# Patient Record
Sex: Female | Born: 1940 | Race: White | Hispanic: No | Smoking: Never smoker
Health system: Southern US, Community
[De-identification: ages and names within clinical notes are randomized; demographics above are authoritative.]

## PROBLEM LIST (undated history)

## (undated) DIAGNOSIS — E119 Type 2 diabetes mellitus without complications: Secondary | ICD-10-CM

## (undated) DIAGNOSIS — M1990 Unspecified osteoarthritis, unspecified site: Secondary | ICD-10-CM

## (undated) DIAGNOSIS — I10 Essential (primary) hypertension: Secondary | ICD-10-CM

## (undated) DIAGNOSIS — T4145XA Adverse effect of unspecified anesthetic, initial encounter: Secondary | ICD-10-CM

## (undated) HISTORY — PX: TONSILLECTOMY: SUR1361

## (undated) HISTORY — PX: EYE SURGERY: SHX253

## (undated) HISTORY — PX: CHOLECYSTECTOMY: SHX55

---

## 2015-05-30 DIAGNOSIS — T8859XA Other complications of anesthesia, initial encounter: Secondary | ICD-10-CM

## 2015-05-30 HISTORY — PX: BACK SURGERY: SHX140

## 2015-05-30 HISTORY — DX: Other complications of anesthesia, initial encounter: T88.59XA

## 2017-07-23 ENCOUNTER — Other Ambulatory Visit: Payer: Self-pay | Admitting: Orthopedic Surgery

## 2017-08-15 NOTE — Patient Instructions (Addendum)
Helen NationsJane Horton  08/15/2017   Your procedure is scheduled on: Friday 08/24/2017  Report to [facility] Main  Entrance              Report to admitting at  0705  AM    Call this number if you have problems the morning of surgery [phone]                Remember: Do not eat food or drink liquids :After Midnight.   How to Manage Your Diabetes Before and After Surgery  Why is it important to control my blood sugar before and after surgery? . Improving blood sugar levels before and after surgery helps healing and can limit problems. . A way of improving blood sugar control is eating a healthy diet by: o  Eating less sugar and carbohydrates o  Increasing activity/exercise o  Talking with your doctor about reaching your blood sugar goals . High blood sugars (greater than 180 mg/dL) can raise your risk of infections and slow your recovery, so you will need to focus on controlling your diabetes during the weeks before surgery. . Make sure that the doctor who takes care of your diabetes knows about your planned surgery including the date and location.  How do I manage my blood sugar before surgery? . Check your blood sugar at least 4 times a day, starting 2 days before surgery, to make sure that the level is not too high or low. o Check your blood sugar the morning of your surgery when you wake up and every 2 hours until you get to the Short Stay unit. . If your blood sugar is less than 70 mg/dL, you will need to treat for low blood sugar: o Do not take insulin. o Treat a low blood sugar (less than 70 mg/dL) with  cup of clear juice (cranberry or apple), 4 glucose tablets, OR glucose gel. o Recheck blood sugar in 15 minutes after treatment (to make sure it is greater than 70 mg/dL). If your blood sugar is not greater than 70 mg/dL on recheck, call [phone] for further instructions. . Report your blood sugar to the short stay nurse when you get to Short Stay.  . If  you are admitted to the hospital after surgery: o Your blood sugar will be checked by the staff and you will probably be given insulin after surgery (instead of oral diabetes medicines) to make sure you have good blood sugar levels. o The goal for blood sugar control after surgery is 80-180 mg/dL.   WHAT DO I DO ABOUT MY DIABETES MEDICATION?            Take the day before surgery on Thursday 08/23/2017 your Metformin as directed.   . Do not take oral diabetes medicines (pills) the morning of surgery.     Take these medicines the morning of surgery with A SIP OF WATER: Levothyroxine (Synthroid), Omeprazole (Prilosec), use eye drops if needed   DO NOT TAKE ANY DIABETIC MEDICATIONS DAY OF YOUR SURGERY                               You may not have any metal on your body including hair pins and              piercings  Do not wear jewelry, make-up, lotions, powders or  perfumes, deodorant             Do not wear nail polish.  Do not shave  48 hours prior to surgery.              Men may shave face and neck.   Do not bring valuables to the hospital. [facility] IS NOT             RESPONSIBLE   FOR VALUABLES.  Contacts, dentures or bridgework may not be worn into surgery.  Leave suitcase in the car. After surgery it may be brought to your room.                  Please read over the following fact sheets you were given: _____________________________________________________________________             [facility] - Preparing for Surgery Before surgery, you can play an important role.  Because skin is not sterile, your skin needs to be as free of germs as possible.  You can reduce the number of germs on your skin by washing with CHG (chlorahexidine gluconate) soap before surgery.  CHG is an antiseptic cleaner which kills germs and bonds with the skin to continue killing germs even after washing. Please DO NOT use if you have an allergy to CHG or antibacterial soaps.  If your skin becomes  reddened/irritated stop using the CHG and inform your nurse when you arrive at Short Stay. Do not shave (including legs and underarms) for at least 48 hours prior to the first CHG shower.  You may shave your face/neck. Please follow these instructions carefully:  1.  Shower with CHG Soap the night before surgery and the  morning of Surgery.  2.  If you choose to wash your hair, wash your hair first as usual with your  normal  shampoo.  3.  After you shampoo, rinse your hair and body thoroughly to remove the  shampoo.                           4.  Use CHG as you would any other liquid soap.  You can apply chg directly  to the skin and wash                       Gently with a scrungie or clean washcloth.  5.  Apply the CHG Soap to your body ONLY FROM THE NECK DOWN.   Do not use on face/ open                           Wound or open sores. Avoid contact with eyes, ears mouth and genitals (private parts).                       Wash face,  Genitals (private parts) with your normal soap.             6.  Wash thoroughly, paying special attention to the area where your surgery  will be performed.  7.  Thoroughly rinse your body with warm water from the neck down.  8.  DO NOT shower/wash with your normal soap after using and rinsing off  the CHG Soap.                9.  Pat yourself dry with a clean towel.  10.  Wear clean pajamas.            11.  Place clean sheets on your bed the night of your first shower and do not  sleep with pets. Day of Surgery : Do not apply any lotions/deodorants the morning of surgery.  Please wear clean clothes to the hospital/surgery center.  FAILURE TO FOLLOW THESE INSTRUCTIONS MAY RESULT IN THE CANCELLATION OF YOUR SURGERY PATIENT SIGNATURE_________________________________  NURSE SIGNATURE__________________________________  ________________________________________________________________________   Helen Horton  An incentive spirometer is a tool that  can help keep your lungs clear and active. This tool measures how well you are filling your lungs with each breath. Taking long deep breaths may help reverse or decrease the chance of developing breathing (pulmonary) problems (especially infection) following:  A long period of time when you are unable to move or be active. BEFORE THE PROCEDURE   If the spirometer includes an indicator to show your best effort, your nurse or respiratory therapist will set it to a desired goal.  If possible, sit up straight or lean slightly forward. Try not to slouch.  Hold the incentive spirometer in an upright position. INSTRUCTIONS FOR USE  1. Sit on the edge of your bed if possible, or sit up as far as you can in bed or on a chair. 2. Hold the incentive spirometer in an upright position. 3. Breathe out normally. 4. Place the mouthpiece in your mouth and seal your lips tightly around it. 5. Breathe in slowly and as deeply as possible, raising the piston or the ball toward the top of the column. 6. Hold your breath for 3-5 seconds or for as long as possible. Allow the piston or ball to fall to the bottom of the column. 7. Remove the mouthpiece from your mouth and breathe out normally. 8. Rest for a few seconds and repeat Steps 1 through 7 at least 10 times every 1-2 hours when you are awake. Take your time and take a few normal breaths between deep breaths. 9. The spirometer may include an indicator to show your best effort. Use the indicator as a goal to work toward during each repetition. 10. After each set of 10 deep breaths, practice coughing to be sure your lungs are clear. If you have an incision (the cut made at the time of surgery), support your incision when coughing by placing a pillow or rolled up towels firmly against it. Once you are able to get out of bed, walk around indoors and cough well. You may stop using the incentive spirometer when instructed by your caregiver.  RISKS AND  COMPLICATIONS  Take your time so you do not get dizzy or light-headed.  If you are in pain, you may need to take or ask for pain medication before doing incentive spirometry. It is harder to take a deep breath if you are having pain. AFTER USE  Rest and breathe slowly and easily.  It can be helpful to keep track of a log of your progress. Your caregiver can provide you with a simple table to help with this. If you are using the spirometer at home, follow these instructions: SEEK MEDICAL CARE IF:   You are having difficultly using the spirometer.  You have trouble using the spirometer as often as instructed.  Your pain medication is not giving enough relief while using the spirometer.  You develop fever of 100.5 F (38.1 C) or higher. SEEK IMMEDIATE MEDICAL CARE IF:   You cough up bloody  sputum that had not been present before.  You develop fever of 102 F (38.9 C) or greater.  You develop worsening pain at or near the incision site. MAKE SURE YOU:   Understand these instructions.  Will watch your condition.  Will get help right away if you are not doing well or get worse. Document Released: 09/25/2006 Document Revised: 08/07/2011 Document Reviewed: 11/26/2006 Exit[employer] Patient Information 2014 [employer], [place].   ________________________________________________________________________  WHAT IS A BLOOD TRANSFUSION? Blood Transfusion Information  A transfusion is the replacement of blood or some of its parts. Blood is made up of multiple cells which provide different functions.  Red blood cells carry oxygen and are used for blood loss replacement.  White blood cells fight against infection.  Platelets control bleeding.  Plasma helps clot blood.  Other blood products are available for specialized needs, such as hemophilia or other clotting disorders. BEFORE THE TRANSFUSION  Who gives blood for transfusions?   Healthy volunteers who are fully evaluated to make sure  their blood is safe. This is blood bank blood. Transfusion therapy is the safest it has ever been in the practice of medicine. Before blood is taken from a donor, a complete history is taken to make sure that person has no history of diseases nor engages in risky social behavior (examples are intravenous drug use or sexual activity with multiple partners). The donor's travel history is screened to minimize risk of transmitting infections, such as malaria. The donated blood is tested for signs of infectious diseases, such as HIV and hepatitis. The blood is then tested to be sure it is compatible with you in order to minimize the chance of a transfusion reaction. If you or a relative donates blood, this is often done in anticipation of surgery and is not appropriate for emergency situations. It takes many days to process the donated blood. RISKS AND COMPLICATIONS Although transfusion therapy is very safe and saves many lives, the main dangers of transfusion include:   Getting an infectious disease.  Developing a transfusion reaction. This is an allergic reaction to something in the blood you were given. Every precaution is taken to prevent this. The decision to have a blood transfusion has been considered carefully by your caregiver before blood is given. Blood is not given unless the benefits outweigh the risks. AFTER THE TRANSFUSION  Right after receiving a blood transfusion, you will usually feel much better and more energetic. This is especially true if your red blood cells have gotten low (anemic). The transfusion raises the level of the red blood cells which carry oxygen, and this usually causes an energy increase.  The nurse administering the transfusion will monitor you carefully for complications. HOME CARE INSTRUCTIONS  No special instructions are needed after a transfusion. You may find your energy is better. Speak with your caregiver about any limitations on activity for underlying diseases  you may have. SEEK MEDICAL CARE IF:   Your condition is not improving after your transfusion.  You develop redness or irritation at the intravenous (IV) site. SEEK IMMEDIATE MEDICAL CARE IF:  Any of the following symptoms occur over the next 12 hours:  Shaking chills.  You have a temperature by mouth above 102 F (38.9 C), not controlled by medicine.  Chest, back, or muscle pain.  People around you feel you are not acting correctly or are confused.  Shortness of breath or difficulty breathing.  Dizziness and fainting.  You get a rash or develop hives.  You have a  decrease in urine output.  Your urine turns a dark color or changes to pink, red, or brown. Any of the following symptoms occur over the next 10 days:  You have a temperature by mouth above 102 F (38.9 C), not controlled by medicine.  Shortness of breath.  Weakness after normal activity.  The white part of the eye turns yellow (jaundice).  You have a decrease in the amount of urine or are urinating less often.  Your urine turns a dark color or changes to pink, red, or brown. Document Released: 05/12/2000 Document Revised: 08/07/2011 Document Reviewed: 12/30/2007 [facility] Patient Information 2014 [place], [place].  _______________________________________________________________________

## 2017-08-17 ENCOUNTER — Encounter (HOSPITAL_COMMUNITY): Payer: Self-pay | Admitting: *Deleted

## 2017-08-17 ENCOUNTER — Ambulatory Visit (HOSPITAL_COMMUNITY)
Admission: RE | Admit: 2017-08-17 | Discharge: 2017-08-17 | Disposition: A | Discharge status: Home / Self Care | Payer: Medicare Other | Source: Ambulatory Visit | Attending: Orthopedic Surgery | Admitting: Orthopedic Surgery

## 2017-08-17 ENCOUNTER — Other Ambulatory Visit: Payer: Self-pay

## 2017-08-17 ENCOUNTER — Encounter (HOSPITAL_COMMUNITY)
Admission: RE | Admit: 2017-08-17 | Discharge: 2017-08-17 | Disposition: A | Discharge status: Home / Self Care | Payer: Medicare Other | Source: Ambulatory Visit | Attending: Orthopedic Surgery | Admitting: Orthopedic Surgery

## 2017-08-17 DIAGNOSIS — Z01818 Encounter for other preprocedural examination: Secondary | ICD-10-CM

## 2017-08-17 DIAGNOSIS — E119 Type 2 diabetes mellitus without complications: Secondary | ICD-10-CM | POA: Insufficient documentation

## 2017-08-17 DIAGNOSIS — J9811 Atelectasis: Secondary | ICD-10-CM | POA: Diagnosis not present

## 2017-08-17 DIAGNOSIS — I10 Essential (primary) hypertension: Secondary | ICD-10-CM | POA: Diagnosis not present

## 2017-08-17 HISTORY — DX: Unspecified osteoarthritis, unspecified site: M19.90

## 2017-08-17 HISTORY — DX: Essential (primary) hypertension: I10

## 2017-08-17 HISTORY — DX: Type 2 diabetes mellitus without complications: E11.9

## 2017-08-17 HISTORY — DX: Adverse effect of unspecified anesthetic, initial encounter: T41.45XA

## 2017-08-17 LAB — CBC WITH DIFFERENTIAL/PLATELET
BASOS PCT: 1 %
Basophils Absolute: 0.1 10*3/uL (ref 0.0–0.1)
Eosinophils Absolute: 0.2 10*3/uL (ref 0.0–0.7)
Eosinophils Relative: 2 %
HEMATOCRIT: 38.9 % (ref 36.0–46.0)
HEMOGLOBIN: 12.9 g/dL (ref 12.0–15.0)
LYMPHS ABS: 1.9 10*3/uL (ref 0.7–4.0)
LYMPHS PCT: 27 %
MCH: 28.5 pg (ref 26.0–34.0)
MCHC: 33.2 g/dL (ref 30.0–36.0)
MCV: 85.9 fL (ref 78.0–100.0)
MONO ABS: 0.8 10*3/uL (ref 0.1–1.0)
MONOS PCT: 11 %
NEUTROS PCT: 59 %
Neutro Abs: 4.1 10*3/uL (ref 1.7–7.7)
Platelets: 330 10*3/uL (ref 150–400)
RBC: 4.53 MIL/uL (ref 3.87–5.11)
RDW: 12.5 % (ref 11.5–15.5)
WBC: 7 10*3/uL (ref 4.0–10.5)

## 2017-08-17 LAB — URINALYSIS, ROUTINE W REFLEX MICROSCOPIC
BACTERIA UA: NONE SEEN
BILIRUBIN URINE: NEGATIVE
GLUCOSE, UA: NEGATIVE mg/dL
KETONES UR: NEGATIVE mg/dL
Leukocytes, UA: NEGATIVE
NITRITE: NEGATIVE
PROTEIN: NEGATIVE mg/dL
Specific Gravity, Urine: 1.012 (ref 1.005–1.030)
pH: 6 (ref 5.0–8.0)

## 2017-08-17 LAB — PROTIME-INR
INR: 1.06
PROTHROMBIN TIME: 13.8 s (ref 11.4–15.2)

## 2017-08-17 LAB — COMPREHENSIVE METABOLIC PANEL
ALK PHOS: 84 U/L (ref 38–126)
ALT: 15 U/L (ref 14–54)
ANION GAP: 13 (ref 5–15)
AST: 27 U/L (ref 15–41)
Albumin: 4 g/dL (ref 3.5–5.0)
BILIRUBIN TOTAL: 0.7 mg/dL (ref 0.3–1.2)
BUN: 13 mg/dL (ref 6–20)
CALCIUM: 9.2 mg/dL (ref 8.9–10.3)
CO2: 28 mmol/L (ref 22–32)
Chloride: 96 mmol/L — ABNORMAL LOW (ref 101–111)
Creatinine, Ser: 0.74 mg/dL (ref 0.44–1.00)
GFR calc Af Amer: >60 mL/min (ref >60)
GLUCOSE: 84 mg/dL (ref 65–99)
Potassium: 3.7 mmol/L (ref 3.5–5.1)
Sodium: 137 mmol/L (ref 135–145)
TOTAL PROTEIN: 7.4 g/dL (ref 6.5–8.1)

## 2017-08-17 LAB — GLUCOSE, CAPILLARY: Glucose-Capillary: 85 mg/dL (ref 65–99)

## 2017-08-17 LAB — ABO/RH: ABO/RH(D): O POS

## 2017-08-17 LAB — HEMOGLOBIN A1C
HEMOGLOBIN A1C: 5.8 % — AB (ref 4.8–5.6)
Mean Plasma Glucose: 119.76 mg/dL

## 2017-08-17 LAB — SURGICAL PCR SCREEN
MRSA, PCR: INVALID — AB
Staphylococcus aureus: INVALID — AB

## 2017-08-17 LAB — APTT: aPTT: 30 seconds (ref 24–36)

## 2017-08-20 LAB — MRSA CULTURE: CULTURE: NOT DETECTED

## 2017-08-21 NOTE — Progress Notes (Signed)
Called and spoke to patient and instructed her to arrive at 0530 and call [phone] on the phone at the volunteer desk and have a seat until to the nurse comes to get her to take her to Short Stay. Patient verbalized understanding.

## 2017-08-23 NOTE — Anesthesia Preprocedure Evaluation (Addendum)
Anesthesia Evaluation  Patient identified by MRN, date of birth, ID band Patient awake    Reviewed: Allergy & Precautions, NPO status , Patient's Chart, lab work & pertinent test results  Airway Mallampati: II  TM Distance: >3 FB Neck ROM: Full    Dental no notable dental hx.    Pulmonary neg pulmonary ROS,    Pulmonary exam normal breath sounds clear to auscultation       Cardiovascular Exercise Tolerance: Good hypertension, Normal cardiovascular exam Rhythm:Regular Rate:Normal     Neuro/Psych negative neurological ROS     GI/Hepatic negative GI ROS,   Endo/Other  diabetes, Type 2  Renal/GU      Musculoskeletal  (+) Arthritis ,   Abdominal   Peds  Hematology   Anesthesia Other Findings   Reproductive/Obstetrics                            Lab Results  Component Value Date   WBC 7.0 08/17/2017   HGB 12.9 08/17/2017   HCT 38.9 08/17/2017   MCV 85.9 08/17/2017   PLT 330 08/17/2017    Anesthesia Physical Anesthesia Plan  ASA: II  Anesthesia Plan: Spinal   Post-op Pain Management:    Induction:   PONV Risk Score and Plan:   Airway Management Planned: Mask, Natural Airway and Nasal Cannula  Additional Equipment:   Intra-op Plan:   Post-operative Plan:   Informed Consent: I have reviewed the patients History and Physical, chart, labs and discussed the procedure including the risks, benefits and alternatives for the proposed anesthesia with the patient or authorized representative who has indicated his/her understanding and acceptance.     Plan Discussed with: CRNA  Anesthesia Plan Comments:         Anesthesia Quick Evaluation

## 2017-08-24 ENCOUNTER — Inpatient Hospital Stay (HOSPITAL_COMMUNITY): Payer: Medicare Other | Admitting: Certified Registered Nurse Anesthetist

## 2017-08-24 ENCOUNTER — Inpatient Hospital Stay (HOSPITAL_COMMUNITY)
Admission: RE | Admit: 2017-08-24 | Discharge: 2017-08-25 | DRG: 470 | Disposition: A | Discharge status: Home Health | Payer: Medicare Other | Source: Ambulatory Visit | Attending: Orthopedic Surgery | Admitting: Orthopedic Surgery

## 2017-08-24 ENCOUNTER — Encounter (HOSPITAL_COMMUNITY): Payer: Self-pay | Admitting: *Deleted

## 2017-08-24 ENCOUNTER — Inpatient Hospital Stay (HOSPITAL_COMMUNITY): Payer: Medicare Other

## 2017-08-24 ENCOUNTER — Encounter (HOSPITAL_COMMUNITY)
Admission: RE | Disposition: A | Discharge status: Home Health | Payer: Self-pay | Source: Ambulatory Visit | Attending: Orthopedic Surgery

## 2017-08-24 ENCOUNTER — Other Ambulatory Visit: Payer: Self-pay

## 2017-08-24 DIAGNOSIS — E119 Type 2 diabetes mellitus without complications: Secondary | ICD-10-CM | POA: Diagnosis present

## 2017-08-24 DIAGNOSIS — M160 Bilateral primary osteoarthritis of hip: Secondary | ICD-10-CM | POA: Diagnosis present

## 2017-08-24 DIAGNOSIS — I10 Essential (primary) hypertension: Secondary | ICD-10-CM | POA: Diagnosis present

## 2017-08-24 DIAGNOSIS — M1611 Unilateral primary osteoarthritis, right hip: Secondary | ICD-10-CM | POA: Diagnosis present

## 2017-08-24 DIAGNOSIS — Z419 Encounter for procedure for purposes other than remedying health state, unspecified: Secondary | ICD-10-CM

## 2017-08-24 HISTORY — PX: TOTAL HIP ARTHROPLASTY: SHX124

## 2017-08-24 LAB — GLUCOSE, CAPILLARY
GLUCOSE-CAPILLARY: 125 mg/dL — AB (ref 65–99)
GLUCOSE-CAPILLARY: 183 mg/dL — AB (ref 65–99)
Glucose-Capillary: 123 mg/dL — ABNORMAL HIGH (ref 65–99)

## 2017-08-24 LAB — TYPE AND SCREEN
ABO/RH(D): O POS
Antibody Screen: NEGATIVE

## 2017-08-24 SURGERY — ARTHROPLASTY, HIP, TOTAL, ANTERIOR APPROACH
Anesthesia: Spinal | Site: Hip | Laterality: Right

## 2017-08-24 MED ORDER — FENTANYL CITRATE (PF) 100 MCG/2ML IJ SOLN
INTRAMUSCULAR | Status: AC
  Filled 2017-08-24: qty 2

## 2017-08-24 MED ORDER — PROPOFOL 500 MG/50ML IV EMUL
INTRAVENOUS | Status: DC | PRN
  Administered 2017-08-24: 50 ug/kg/min via INTRAVENOUS

## 2017-08-24 MED ORDER — DEXAMETHASONE SODIUM PHOSPHATE 10 MG/ML IJ SOLN
INTRAMUSCULAR | Status: AC
  Filled 2017-08-24: qty 1

## 2017-08-24 MED ORDER — HYDROMORPHONE HCL 1 MG/ML IJ SOLN
INTRAMUSCULAR | Status: AC
  Filled 2017-08-24: qty 1

## 2017-08-24 MED ORDER — BISACODYL 5 MG PO TBEC: 5.0000 mg | DELAYED_RELEASE_TABLET | Freq: Every day | ORAL | Status: DC | PRN

## 2017-08-24 MED ORDER — EPHEDRINE SULFATE-NACL 50-0.9 MG/10ML-% IV SOSY
PREFILLED_SYRINGE | INTRAVENOUS | Status: DC | PRN
  Administered 2017-08-24: 10 mg via INTRAVENOUS

## 2017-08-24 MED ORDER — MIDAZOLAM HCL 2 MG/2ML IJ SOLN
INTRAMUSCULAR | Status: AC
  Filled 2017-08-24: qty 2

## 2017-08-24 MED ORDER — ONDANSETRON HCL 4 MG/2ML IJ SOLN
INTRAMUSCULAR | Status: AC
  Filled 2017-08-24: qty 2

## 2017-08-24 MED ORDER — ASPIRIN EC 325 MG PO TBEC: 325.0000 mg | DELAYED_RELEASE_TABLET | Freq: Two times a day (BID) | ORAL | 0 refills | Status: DC

## 2017-08-24 MED ORDER — LISINOPRIL 10 MG PO TABS: 10.0000 mg | ORAL_TABLET | Freq: Every day | ORAL | Status: DC

## 2017-08-24 MED ORDER — SODIUM CHLORIDE 0.9 % IV SOLN
1000.0000 mg | INTRAVENOUS | Status: AC
Start: 2017-08-24 — End: 2017-08-24
  Administered 2017-08-24: 1000 mg via INTRAVENOUS
  Filled 2017-08-24: qty 1100

## 2017-08-24 MED ORDER — BUPIVACAINE HCL (PF) 0.25 % IJ SOLN
INTRAMUSCULAR | Status: DC | PRN
  Administered 2017-08-24: 30 mL

## 2017-08-24 MED ORDER — LACTATED RINGERS IV SOLN
INTRAVENOUS | Status: DC | PRN
  Administered 2017-08-24 (×3): via INTRAVENOUS

## 2017-08-24 MED ORDER — METHOCARBAMOL 1000 MG/10ML IJ SOLN
500.0000 mg | Freq: Four times a day (QID) | INTRAMUSCULAR | Status: DC | PRN
  Administered 2017-08-24: 500 mg via INTRAVENOUS
  Filled 2017-08-24: qty 550

## 2017-08-24 MED ORDER — HYDROCODONE-ACETAMINOPHEN 5-325 MG PO TABS
1.0000 | ORAL_TABLET | ORAL | Status: DC | PRN
  Administered 2017-08-24: 1 via ORAL
  Administered 2017-08-24: 22:00:00 2 via ORAL
  Administered 2017-08-24: 1 via ORAL
  Administered 2017-08-25 (×2): 2 via ORAL
  Filled 2017-08-24: qty 1
  Filled 2017-08-24 (×3): qty 2
  Filled 2017-08-24: qty 1

## 2017-08-24 MED ORDER — HYDROCODONE-ACETAMINOPHEN 5-325 MG PO TABS: 1.0000 | ORAL_TABLET | Freq: Four times a day (QID) | ORAL | 0 refills | Status: DC | PRN

## 2017-08-24 MED ORDER — FENTANYL CITRATE (PF) 100 MCG/2ML IJ SOLN
INTRAMUSCULAR | Status: DC | PRN
  Administered 2017-08-24 (×2): 50 ug via INTRAVENOUS

## 2017-08-24 MED ORDER — MIDAZOLAM HCL 5 MG/5ML IJ SOLN
INTRAMUSCULAR | Status: DC | PRN
  Administered 2017-08-24: 1 mg via INTRAVENOUS

## 2017-08-24 MED ORDER — ONDANSETRON HCL 4 MG PO TABS: 4.0000 mg | ORAL_TABLET | Freq: Four times a day (QID) | ORAL | Status: DC | PRN

## 2017-08-24 MED ORDER — BUPIVACAINE LIPOSOME 1.3 % IJ SUSP
20.0000 mL | Freq: Once | INTRAMUSCULAR | Status: DC
  Filled 2017-08-24: qty 20

## 2017-08-24 MED ORDER — LISINOPRIL-HYDROCHLOROTHIAZIDE 10-12.5 MG PO TABS: 1.0000 | ORAL_TABLET | Freq: Every day | ORAL | Status: DC

## 2017-08-24 MED ORDER — INSULIN ASPART 100 UNIT/ML ~~LOC~~ SOLN
0.0000 [IU] | Freq: Three times a day (TID) | SUBCUTANEOUS | Status: DC
  Administered 2017-08-24: 3 [IU] via SUBCUTANEOUS

## 2017-08-24 MED ORDER — METHOCARBAMOL 500 MG PO TABS
500.0000 mg | ORAL_TABLET | Freq: Four times a day (QID) | ORAL | Status: DC | PRN
  Administered 2017-08-24 – 2017-08-25 (×2): 500 mg via ORAL
  Filled 2017-08-24 (×2): qty 1

## 2017-08-24 MED ORDER — LIDOCAINE 2% (20 MG/ML) 5 ML SYRINGE
INTRAMUSCULAR | Status: DC | PRN
  Administered 2017-08-24: 20 mg via INTRAVENOUS

## 2017-08-24 MED ORDER — CHLORHEXIDINE GLUCONATE 4 % EX LIQD: 60.0000 mL | Freq: Once | CUTANEOUS | Status: DC

## 2017-08-24 MED ORDER — BUPIVACAINE HCL (PF) 0.25 % IJ SOLN
INTRAMUSCULAR | Status: AC
  Filled 2017-08-24: qty 30

## 2017-08-24 MED ORDER — BUPIVACAINE LIPOSOME 1.3 % IJ SUSP
INTRAMUSCULAR | Status: DC | PRN
  Administered 2017-08-24: 20 mL

## 2017-08-24 MED ORDER — DIPHENHYDRAMINE HCL 12.5 MG/5ML PO ELIX: 12.5000 mg | ORAL_SOLUTION | ORAL | Status: DC | PRN

## 2017-08-24 MED ORDER — HYDROCHLOROTHIAZIDE 12.5 MG PO CAPS: 12.5000 mg | ORAL_CAPSULE | Freq: Every day | ORAL | Status: DC

## 2017-08-24 MED ORDER — ACETAMINOPHEN 325 MG PO TABS: 325.0000 mg | ORAL_TABLET | Freq: Four times a day (QID) | ORAL | Status: DC | PRN

## 2017-08-24 MED ORDER — TRANEXAMIC ACID 1000 MG/10ML IV SOLN
1000.0000 mg | Freq: Once | INTRAVENOUS | Status: AC
  Administered 2017-08-24: 1000 mg via INTRAVENOUS
  Filled 2017-08-24: qty 1100

## 2017-08-24 MED ORDER — HYDROMORPHONE HCL 1 MG/ML IJ SOLN
0.2500 mg | INTRAMUSCULAR | Status: DC | PRN
  Administered 2017-08-24 (×4): 0.5 mg via INTRAVENOUS

## 2017-08-24 MED ORDER — ONDANSETRON HCL 4 MG/2ML IJ SOLN
4.0000 mg | Freq: Four times a day (QID) | INTRAMUSCULAR | Status: DC | PRN
  Administered 2017-08-24: 13:00:00 4 mg via INTRAVENOUS
  Filled 2017-08-24: qty 2

## 2017-08-24 MED ORDER — ACETAMINOPHEN 10 MG/ML IV SOLN: 1000.0000 mg | Freq: Once | INTRAVENOUS | Status: DC | PRN

## 2017-08-24 MED ORDER — TIZANIDINE HCL 2 MG PO TABS: 2.0000 mg | ORAL_TABLET | Freq: Three times a day (TID) | ORAL | 0 refills | Status: DC | PRN

## 2017-08-24 MED ORDER — CEFAZOLIN SODIUM-DEXTROSE 2-4 GM/100ML-% IV SOLN
2.0000 g | INTRAVENOUS | Status: AC
  Administered 2017-08-24: 2 g via INTRAVENOUS
  Filled 2017-08-24: qty 100

## 2017-08-24 MED ORDER — PROPOFOL 10 MG/ML IV BOLUS
INTRAVENOUS | Status: AC
  Filled 2017-08-24: qty 60

## 2017-08-24 MED ORDER — SODIUM CHLORIDE 0.9 % IV SOLN
INTRAVENOUS | Status: DC
  Administered 2017-08-24: 12:00:00 via INTRAVENOUS

## 2017-08-24 MED ORDER — POLYETHYLENE GLYCOL 3350 17 G PO PACK: 17.0000 g | PACK | Freq: Every day | ORAL | Status: DC | PRN

## 2017-08-24 MED ORDER — MORPHINE SULFATE (PF) 2 MG/ML IV SOLN: 0.5000 mg | INTRAVENOUS | Status: DC | PRN

## 2017-08-24 MED ORDER — DOCUSATE SODIUM 100 MG PO CAPS
100.0000 mg | ORAL_CAPSULE | Freq: Two times a day (BID) | ORAL | Status: DC
  Administered 2017-08-24 – 2017-08-25 (×2): 100 mg via ORAL
  Filled 2017-08-24 (×2): qty 1

## 2017-08-24 MED ORDER — METFORMIN HCL 500 MG PO TABS
1000.0000 mg | ORAL_TABLET | Freq: Every day | ORAL | Status: DC
  Administered 2017-08-25: 09:00:00 1000 mg via ORAL
  Filled 2017-08-24: qty 2

## 2017-08-24 MED ORDER — BUPIVACAINE IN DEXTROSE 0.75-8.25 % IT SOLN
INTRATHECAL | Status: DC | PRN
  Administered 2017-08-24: 1.8 mL via INTRATHECAL

## 2017-08-24 MED ORDER — SODIUM CHLORIDE 0.9 % IR SOLN
Status: DC | PRN
  Administered 2017-08-24: 1000 mL

## 2017-08-24 MED ORDER — HYDROCODONE-ACETAMINOPHEN 7.5-325 MG PO TABS: 1.0000 | ORAL_TABLET | Freq: Once | ORAL | Status: DC | PRN

## 2017-08-24 MED ORDER — PROPOFOL 10 MG/ML IV BOLUS
INTRAVENOUS | Status: DC | PRN
  Administered 2017-08-24 (×5): 10 mg via INTRAVENOUS

## 2017-08-24 MED ORDER — ALUM & MAG HYDROXIDE-SIMETH 200-200-20 MG/5ML PO SUSP: 30.0000 mL | ORAL | Status: DC | PRN

## 2017-08-24 MED ORDER — TEMAZEPAM 15 MG PO CAPS
15.0000 mg | ORAL_CAPSULE | Freq: Every day | ORAL | Status: DC
  Administered 2017-08-24: 22:00:00 15 mg via ORAL
  Filled 2017-08-24: qty 1

## 2017-08-24 MED ORDER — CEFAZOLIN SODIUM-DEXTROSE 2-4 GM/100ML-% IV SOLN
2.0000 g | Freq: Four times a day (QID) | INTRAVENOUS | Status: AC
  Administered 2017-08-24 (×2): 2 g via INTRAVENOUS
  Filled 2017-08-24 (×2): qty 100

## 2017-08-24 MED ORDER — DOCUSATE SODIUM 100 MG PO CAPS: 100.0000 mg | ORAL_CAPSULE | Freq: Two times a day (BID) | ORAL | 0 refills | Status: AC

## 2017-08-24 MED ORDER — PANTOPRAZOLE SODIUM 40 MG PO TBEC
80.0000 mg | DELAYED_RELEASE_TABLET | Freq: Every day | ORAL | Status: DC
  Administered 2017-08-25: 80 mg via ORAL
  Filled 2017-08-24: qty 2

## 2017-08-24 MED ORDER — LACTATED RINGERS IV SOLN: INTRAVENOUS | Status: DC

## 2017-08-24 MED ORDER — PROMETHAZINE HCL 25 MG/ML IJ SOLN: 6.2500 mg | INTRAMUSCULAR | Status: DC | PRN

## 2017-08-24 MED ORDER — ASPIRIN EC 325 MG PO TBEC
325.0000 mg | DELAYED_RELEASE_TABLET | Freq: Two times a day (BID) | ORAL | Status: DC
  Administered 2017-08-24 – 2017-08-25 (×2): 325 mg via ORAL
  Filled 2017-08-24 (×2): qty 1

## 2017-08-24 MED ORDER — LEVOTHYROXINE SODIUM 88 MCG PO TABS
88.0000 ug | ORAL_TABLET | Freq: Every day | ORAL | Status: DC
  Administered 2017-08-25: 09:00:00 88 ug via ORAL
  Filled 2017-08-24: qty 1

## 2017-08-24 MED ORDER — DEXAMETHASONE SODIUM PHOSPHATE 10 MG/ML IJ SOLN
INTRAMUSCULAR | Status: DC | PRN
  Administered 2017-08-24: 10 mg via INTRAVENOUS

## 2017-08-24 MED ORDER — MEPERIDINE HCL 50 MG/ML IJ SOLN: 6.2500 mg | INTRAMUSCULAR | Status: DC | PRN

## 2017-08-24 MED ORDER — ONDANSETRON HCL 4 MG/2ML IJ SOLN
INTRAMUSCULAR | Status: DC | PRN
  Administered 2017-08-24: 4 mg via INTRAVENOUS

## 2017-08-24 MED ORDER — MAGNESIUM CITRATE PO SOLN: 1.0000 | Freq: Once | ORAL | Status: DC | PRN

## 2017-08-24 SURGICAL SUPPLY — 39 items
BAG ZIPLOCK 12X15 (MISCELLANEOUS) ×3 IMPLANT
BENZOIN TINCTURE PRP APPL 2/3 (GAUZE/BANDAGES/DRESSINGS) ×3 IMPLANT
BLADE SAW SGTL 18X1.27X75 (BLADE) ×2 IMPLANT
BLADE SAW SGTL 18X1.27X75MM (BLADE) ×1
BNDG COHESIVE 6X5 TAN STRL LF (GAUZE/BANDAGES/DRESSINGS) IMPLANT
CAPT HIP TOTAL 2 ×3 IMPLANT
CELLS DAT CNTRL 66122 CELL SVR (MISCELLANEOUS) ×1 IMPLANT
CLOSURE WOUND 1/2 X4 (GAUZE/BANDAGES/DRESSINGS) ×1
COVER PERINEAL POST (MISCELLANEOUS) ×3 IMPLANT
COVER SURGICAL LIGHT HANDLE (MISCELLANEOUS) ×3 IMPLANT
DRAPE STERI IOBAN 125X83 (DRAPES) ×3 IMPLANT
DRAPE U-SHAPE 47X51 STRL (DRAPES) ×6 IMPLANT
DRSG AQUACEL AG ADV 3.5X 6 (GAUZE/BANDAGES/DRESSINGS) ×3 IMPLANT
DRSG AQUACEL AG ADV 3.5X10 (GAUZE/BANDAGES/DRESSINGS) IMPLANT
DURAPREP 26ML APPLICATOR (WOUND CARE) ×3 IMPLANT
ELECT REM PT RETURN 15FT ADLT (MISCELLANEOUS) ×3 IMPLANT
GAUZE XEROFORM 1X8 LF (GAUZE/BANDAGES/DRESSINGS) IMPLANT
GLOVE BIOGEL PI IND STRL 8 (GLOVE) ×2 IMPLANT
GLOVE BIOGEL PI INDICATOR 8 (GLOVE) ×4
GLOVE ECLIPSE 7.5 STRL STRAW (GLOVE) ×6 IMPLANT
GOWN STRL REUS W/TWL XL LVL3 (GOWN DISPOSABLE) ×6 IMPLANT
HOLDER FOLEY CATH W/STRAP (MISCELLANEOUS) ×3 IMPLANT
HOOD PEEL AWAY FLYTE STAYCOOL (MISCELLANEOUS) ×6 IMPLANT
NEEDLE HYPO 22GX1.5 SAFETY (NEEDLE) ×3 IMPLANT
PACK ANTERIOR HIP CUSTOM (KITS) ×3 IMPLANT
RTRCTR WOUND ALEXIS 18CM MED (MISCELLANEOUS) ×3
STAPLER VISISTAT 35W (STAPLE) IMPLANT
STRIP CLOSURE SKIN 1/2X4 (GAUZE/BANDAGES/DRESSINGS) ×2 IMPLANT
SUT ETHIBOND NAB CT1 #1 30IN (SUTURE) ×6 IMPLANT
SUT MNCRL AB 3-0 PS2 18 (SUTURE) IMPLANT
SUT MON AB 3-0 SH 27 (SUTURE) ×2
SUT MON AB 3-0 SH27 (SUTURE) ×1 IMPLANT
SUT VIC AB 0 CT1 36 (SUTURE) ×3 IMPLANT
SUT VIC AB 1 CT1 36 (SUTURE) ×6 IMPLANT
SUT VIC AB 2-0 CT1 27 (SUTURE) ×4
SUT VIC AB 2-0 CT1 TAPERPNT 27 (SUTURE) ×2 IMPLANT
TRAY FOLEY CATH SILVER 14FR (SET/KITS/TRAYS/PACK) ×3 IMPLANT
TRAY FOLEY W/METER SILVER 16FR (SET/KITS/TRAYS/PACK) IMPLANT
YANKAUER SUCT BULB TIP NO VENT (SUCTIONS) ×3 IMPLANT

## 2017-08-24 NOTE — Anesthesia Postprocedure Evaluation (Signed)
Anesthesia Post Note  Patient: Helen NationsJane Horton  Procedure(s) Performed: RIGHT TOTAL HIP ARTHROPLASTY ANTERIOR APPROACH (Right Hip)     Patient location during evaluation: PACU Anesthesia Type: Spinal Level of consciousness: oriented and awake and alert Pain management: pain level controlled Vital Signs Assessment: post-procedure vital signs reviewed and stable Respiratory status: spontaneous breathing, respiratory function stable and patient connected to nasal cannula oxygen Cardiovascular status: blood pressure returned to baseline and stable Postop Assessment: no headache, no backache and no apparent nausea or vomiting Anesthetic complications: no    Last Vitals:  Vitals:   08/24/17 1030 08/24/17 1045  BP: 138/61 138/60  Pulse: 84 91  Resp: 15 14  Temp: 36.6 C 36.6 C  SpO2: 100% 100%    Last Pain:  Vitals:   08/24/17 1030  TempSrc:   PainSc: 3                  Trevor IhaStephen A Marc Sivertsen

## 2017-08-24 NOTE — Brief Op Note (Signed)
08/24/2017  11:25 AM  PATIENT:  Helen Horton  77 y.o. female  PRE-OPERATIVE DIAGNOSIS:  OSTEOARTHRITIS RIGHT HIP  POST-OPERATIVE DIAGNOSIS:  OSTEOARTHRITIS RIGHT HIP  PROCEDURE:  Procedure(s): RIGHT TOTAL HIP ARTHROPLASTY ANTERIOR APPROACH (Right)  SURGEON:  Surgeon(s) and Role:    Jodi Geralds* Theia Dezeeuw, MD - Primary  PHYSICIAN ASSISTANT:   ASSISTANTS: bethune   ANESTHESIA:   spinal  EBL:  200 mL   BLOOD ADMINISTERED:none  DRAINS: none   LOCAL MEDICATIONS USED:  MARCAINE    and OTHER experel  SPECIMEN:  No Specimen  DISPOSITION OF SPECIMEN:  N/A  COUNTS:  YES  TOURNIQUET:  * No tourniquets in log *  DICTATION: .Other Dictation: Dictation Number [id number]  PLAN OF CARE: Admit to inpatient   PATIENT DISPOSITION:  PACU - hemodynamically stable.   Delay start of Pharmacological VTE agent (>24hrs) due to surgical blood loss or risk of bleeding: no

## 2017-08-24 NOTE — Transfer of Care (Signed)
Immediate Anesthesia Transfer of Care Note  Patient: Helen NationsJane Whetsel  Procedure(s) Performed: RIGHT TOTAL HIP ARTHROPLASTY ANTERIOR APPROACH (Right Hip)  Patient Location: PACU  Anesthesia Type:Spinal  Level of Consciousness: drowsy and patient cooperative  Airway & Oxygen Therapy: Patient Spontanous Breathing and Patient connected to face mask oxygen  Post-op Assessment: Report given to RN and Post -op Vital signs reviewed and stable  Post vital signs: Reviewed and stable  Last Vitals:  Vitals Value Taken Time  BP    Temp    Pulse 84 08/24/2017  9:26 AM  Resp 13 08/24/2017  9:26 AM  SpO2 100 % 08/24/2017  9:26 AM  Vitals shown include unvalidated device data.  Last Pain:  Vitals:   08/24/17 0628  TempSrc:   PainSc: 3       Patients Stated Pain Goal: 4 (08/24/17 [id number])  Complications: No apparent anesthesia complications

## 2017-08-24 NOTE — Anesthesia Procedure Notes (Signed)
Spinal  Patient location during procedure: OR Start time: 08/24/2017 7:23 AM End time: 08/24/2017 7:30 AM Staffing Anesthesiologist: Trevor IhaHouser, Tristen Pennino A, MD Performed: anesthesiologist  Preanesthetic Checklist Completed: patient identified, surgical consent, pre-op evaluation, timeout performed, IV checked, risks and benefits discussed and monitors and equipment checked Spinal Block Patient position: sitting Prep: site prepped and draped and DuraPrep Patient monitoring: heart rate, cardiac monitor, continuous pulse ox and blood pressure Approach: midline Location: L2-3 Injection technique: single-shot Needle Needle type: Pencan  Needle gauge: 24 G Needle length: 10 cm Assessment Sensory level: T4

## 2017-08-24 NOTE — Discharge Instructions (Signed)

## 2017-08-24 NOTE — Evaluation (Signed)
Physical Therapy Evaluation Patient Details Name: Helen Horton MRN: [id number] DOB: Oct 13, 1940 Today's Date: 08/24/2017   History of Present Illness  [age] y.o. female admitted for R DA-THA. PMH of L4-L5 laminectomy  Clinical Impression  Pt is s/p THA resulting in the deficits listed below (see PT Problem List). Pt ambulated 3455' with RW and performed THA exercises with min assist. Good progress expected.  Pt will benefit from skilled PT to increase their independence and safety with mobility to allow discharge to the venue listed below.      Follow Up Recommendations Follow surgeon's recommendation for DC plan and follow-up therapies    Equipment Recommendations  Rolling walker with 5" wheels    Recommendations for Other Services       Precautions / Restrictions Precautions Precautions: Fall Precaution Comments: denies h/o falls Restrictions Weight Bearing Restrictions: No Other Position/Activity Restrictions: wbat      Mobility  Bed Mobility Overal bed mobility: Modified Independent             General bed mobility comments: HOB up  Transfers Overall transfer level: Needs assistance Equipment used: Rolling walker (2 wheeled) Transfers: Sit to/from Stand Sit to Stand: Min guard         General transfer comment: VCs hand placement  Ambulation/Gait Ambulation/Gait assistance: Min guard Ambulation Distance (Feet): 55 Feet Assistive device: Rolling walker (2 wheeled) Gait Pattern/deviations: Step-to pattern     General Gait Details: steady with RW, no loss of balance, VCs sequencing  Stairs            Wheelchair Mobility    Modified Rankin (Stroke Patients Only)       Balance Overall balance assessment: Modified Independent                                           Pertinent Vitals/Pain Pain Assessment: 0-10 Pain Score: 4  Pain Location: R hip  Pain Descriptors / Indicators: Sore Pain Intervention(s): Limited activity within  patient's tolerance;Monitored during session;Premedicated before session;Ice applied    Home Living Family/patient expects to be discharged to:: Private residence Living Arrangements: Alone Available Help at Discharge: Family;Available 24 hours/day   Home Access: Stairs to enter   Entrance Stairs-Number of Steps: 1 Home Layout: One level Home Equipment: Cane - single point;Bedside commode;Shower seat      Prior Function Level of Independence: Independent         Comments: just started using cane a few days PTA     Hand Dominance        Extremity/Trunk Assessment   Upper Extremity Assessment Upper Extremity Assessment: Overall WFL for tasks assessed    Lower Extremity Assessment Lower Extremity Assessment: RLE deficits/detail RLE Deficits / Details: knee ext +3/5, hip AAROM WFL, hip strength +2/5 RLE Sensation: WNL RLE Coordination: WNL       Communication   Communication: No difficulties  Cognition Arousal/Alertness: Awake/alert Behavior During Therapy: WFL for tasks assessed/performed Overall Cognitive Status: Within Functional Limits for tasks assessed                                        General Comments      Exercises Total Joint Exercises Ankle Circles/Pumps: AROM;Both;10 reps;Supine Quad Sets: AROM;Right;10 reps;Supine Heel Slides: AAROM;Right;10 reps;Supine Hip ABduction/ADduction: AAROM;Right;10 reps;Supine Long Arc Quad:  AROM;Right;5 reps;Seated   Assessment/Plan    PT Assessment Patient needs continued PT services  PT Problem List Decreased strength;Decreased activity tolerance;Decreased mobility;Decreased knowledge of use of DME;Pain       PT Treatment Interventions DME instruction;Gait training;Stair training;Therapeutic exercise;Therapeutic activities;Functional mobility training;Patient/family education    PT Goals (Current goals can be found in the Care Plan section)  Acute Rehab PT Goals Patient Stated Goal: to  walk PT Goal Formulation: With patient/family Time For Goal Achievement: 08/31/17 Potential to Achieve Goals: Good    Frequency 7X/week   Barriers to discharge        Co-evaluation               AM-PAC PT "6 Clicks" Daily Activity  Outcome Measure Difficulty turning over in bed (including adjusting bedclothes, sheets and blankets)?: None Difficulty moving from lying on back to sitting on the side of the bed? : A Little Difficulty sitting down on and standing up from a chair with arms (e.g., wheelchair, bedside commode, etc,.)?: A Little Help needed moving to and from a bed to chair (including a wheelchair)?: A Little Help needed walking in hospital room?: A Little Help needed climbing 3-5 steps with a railing? : A Lot 6 Click Score: 18    End of Session Equipment Utilized During Treatment: Gait belt Activity Tolerance: Patient tolerated treatment well Patient left: in chair;with call bell/phone within reach;with family/visitor present Nurse Communication: Mobility status PT Visit Diagnosis: Pain;Difficulty in walking, not elsewhere classified (R26.2) Pain - Right/Left: Right Pain - part of body: Hip    Time: 4098-1191 PT Time Calculation (min) (ACUTE ONLY): 30 min   Charges:   PT Evaluation $PT Eval Low Complexity: 1 Low PT Treatments $Gait Training: 8-22 mins   PT G Codes:          Tamala Ser 08/24/2017, 2:52 PM [phone]

## 2017-08-24 NOTE — H&P (Signed)
TOTAL HIP ADMISSION H&P  Patient is admitted for right total hip arthroplasty.  Subjective:  Chief Complaint: right hip pain  HPI: Helen Horton, 77 y.o. female, has a history of pain and functional disability in the right hip(s) due to arthritis and patient has failed non-surgical conservative treatments for greater than 12 weeks to include NSAID's and/or analgesics, viscosupplementation injections, use of assistive devices and activity modification.  Onset of symptoms was gradual starting 6 years ago with gradually worsening course since that time.The patient noted no past surgery on the right hip(s).  Patient currently rates pain in the right hip at 9 out of 10 with activity. Patient has night pain, worsening of pain with activity and weight bearing, trendelenberg gait, pain that interfers with activities of daily living, pain with passive range of motion, crepitus and joint swelling. Patient has evidence of subchondral cysts, subchondral sclerosis, periarticular osteophytes, joint subluxation and joint space narrowing by imaging studies. This condition presents safety issues increasing the risk of falls. This patient has had Failure of all reasonable conservative care.  There is no current active infection.  There are no active problems to display for this patient.  Past Medical History:  Diagnosis Date  . Arthritis   . Complication of anesthesia 2017   took really a long time waking up from back surgery  . Diabetes mellitus without complication ([place])   . Hypertension     Past Surgical History:  Procedure Laterality Date  . BACK SURGERY  2017   lumbar L4-5 laminectomy  . CHOLECYSTECTOMY    . EYE SURGERY     bilateral cataract with lens implant  . TONSILLECTOMY      Current Facility-Administered Medications  Medication Dose Route Frequency Provider Last Rate Last Dose  . bupivacaine liposome (EXPAREL) 1.3 % injection 266 mg  20 mL Infiltration Once Dorna Leitz, MD      . ceFAZolin  (ANCEF) IVPB 2g/100 mL premix  2 g Intravenous On Call to OR Dorna Leitz, MD      . chlorhexidine (HIBICLENS) 4 % liquid 4 application  60 mL Topical Once Dorna Leitz, MD      . tranexamic acid (CYKLOKAPRON) 1,000 mg in sodium chloride 0.9 % 100 mL IVPB  1,000 mg Intravenous To OR Dorna Leitz, MD       Facility-Administered Medications Ordered in Other Encounters  Medication Dose Route Frequency Provider Last Rate Last Dose  . lactated ringers infusion    Continuous PRN Montel Clock, CRNA       No Known Allergies  Social History   Tobacco Use  . Smoking status: Never Smoker  . Smokeless tobacco: Never Used  Substance Use Topics  . Alcohol use: Yes    Comment: rarely wine    History reviewed. No pertinent family history.   ROS ROS: I have reviewed the patient's review of systems thoroughly and there are no positive responses as relates to the HPI. Objective:  Physical Exam  Vital signs in last 24 hours: Temp:  [98.4 F (36.9 C)] 98.4 F (36.9 C) (03/29 0551) Pulse Rate:  [82] 82 (03/29 0551) Resp:  [16] 16 (03/29 0551) BP: (141)/(67) 141/67 (03/29 0551) SpO2:  [99 %] 99 % (03/29 0551) Weight:  [60.8 kg (134 lb)] 60.8 kg (134 lb) (03/29 2330) Well-developed well-nourished patient in no acute distress. Alert and oriented x3 HEENT:within normal limits Cardiac: Regular rate and rhythm Pulmonary: Lungs clear to auscultation Abdomen: Soft and nontender.  Normal active bowel sounds  Musculoskeletal: (  Right hip: Limited range of motion.  Painful range of motion.  Essentially 0 internal rotation.  Neurovascularly intact distally. Labs: Recent Results (from the past 2160 hour(s))  Surgical pcr screen     Status: Abnormal   Collection Time: 08/17/17 10:00 AM  Result Value Ref Range   MRSA, PCR INVALID RESULTS, SPECIMEN SENT FOR CULTURE (A) NEGATIVE   Staphylococcus aureus INVALID RESULTS, SPECIMEN SENT FOR CULTURE (A) NEGATIVE    Comment: (NOTE) The Xpert SA Assay (FDA  approved for NASAL specimens in patients 35 years of age and older), is one component of a comprehensive surveillance program. It is not intended to diagnose infection nor to guide or monitor treatment. Performed at [facility], [place] [address] [place], [place] [address]   MRSA culture     Status: None   Collection Time: 08/17/17 10:00 AM  Result Value Ref Range   Specimen Description      NOSE Performed at [facility], [place] [address], [place], [place] [address]    Special Requests      NONE Performed at [facility], [place] [address], [place], [place] [address]    Culture      NO MRSA DETECTED Performed at [facility], [place] [address] [place], [place] [address]    Report Status 08/20/2017 FINAL   Urinalysis, Routine w reflex microscopic     Status: Abnormal   Collection Time: 08/17/17 10:05 AM  Result Value Ref Range   Color, Urine YELLOW YELLOW   APPearance CLEAR CLEAR   Specific Gravity, Urine 1.012 1.005 - 1.030   pH 6.0 5.0 - 8.0   Glucose, UA NEGATIVE NEGATIVE mg/dL   Hgb urine dipstick SMALL (A) NEGATIVE   Bilirubin Urine NEGATIVE NEGATIVE   Ketones, ur NEGATIVE NEGATIVE mg/dL   Protein, ur NEGATIVE NEGATIVE mg/dL   Nitrite NEGATIVE NEGATIVE   Leukocytes, UA NEGATIVE NEGATIVE   RBC / HPF 0-5 0 - 5 RBC/hpf   WBC, UA 0-5 0 - 5 WBC/hpf   Bacteria, UA NONE SEEN NONE SEEN   Squamous Epithelial / LPF 0-5 (A) NONE SEEN   Mucus PRESENT     Comment: Performed at [facility], [place] [address], [place], [place] [address]  Glucose, capillary     Status: None   Collection Time: 08/17/17 10:09 AM  Result Value Ref Range   Glucose-Capillary 85 65 - 99 mg/dL  Type and screen Order type and screen if day of surgery is less than 15 days from draw of preadmission visit or order morning of surgery if day of surgery is greater than 6 days from preadmission visit.     Status: None   Collection Time:  08/17/17 10:37 AM  Result Value Ref Range   ABO/RH(D) O POS    Antibody Screen NEG    Sample Expiration 08/27/2017    Extend sample reason      NO TRANSFUSIONS OR PREGNANCY IN THE PAST 3 MONTHS Performed at [facility], [place] [address], [place], [place] [address]   ABO/Rh     Status: None   Collection Time: 08/17/17 10:37 AM  Result Value Ref Range   ABO/RH(D)      O POS Performed at [facility], [place] [address], [place], [place] [address]   APTT     Status: None   Collection Time: 08/17/17 10:43 AM  Result Value Ref Range   aPTT 30 24 - 36 seconds  Comment: Performed at [facility], [place] [address], [place], [place] [address]  CBC WITH DIFFERENTIAL     Status: None   Collection Time: 08/17/17 10:43 AM  Result Value Ref Range   WBC 7.0 4.0 - 10.5 K/uL   RBC 4.53 3.87 - 5.11 MIL/uL   Hemoglobin 12.9 12.0 - 15.0 g/dL   HCT 38.9 36.0 - 46.0 %   MCV 85.9 78.0 - 100.0 fL   MCH 28.5 26.0 - 34.0 pg   MCHC 33.2 30.0 - 36.0 g/dL   RDW 12.5 11.5 - 15.5 %   Platelets 330 150 - 400 K/uL   Neutrophils Relative % 59 %   Neutro Abs 4.1 1.7 - 7.7 K/uL   Lymphocytes Relative 27 %   Lymphs Abs 1.9 0.7 - 4.0 K/uL   Monocytes Relative 11 %   Monocytes Absolute 0.8 0.1 - 1.0 K/uL   Eosinophils Relative 2 %   Eosinophils Absolute 0.2 0.0 - 0.7 K/uL   Basophils Relative 1 %   Basophils Absolute 0.1 0.0 - 0.1 K/uL    Comment: Performed at [facility], [place] [address] [place], [place] [address]  Comprehensive metabolic panel     Status: Abnormal   Collection Time: 08/17/17 10:43 AM  Result Value Ref Range   Sodium 137 135 - 145 mmol/L   Potassium 3.7 3.5 - 5.1 mmol/L   Chloride 96 (L) 101 - 111 mmol/L   CO2 28 22 - 32 mmol/L   Glucose, Bld 84 65 - 99 mg/dL   BUN 13 6 - 20 mg/dL   Creatinine, Ser 0.74 0.44 - 1.00 mg/dL   Calcium 9.2 8.9 - 10.3 mg/dL   Total Protein 7.4 6.5 - 8.1 g/dL   Albumin 4.0 3.5 - 5.0  g/dL   AST 27 15 - 41 U/L   ALT 15 14 - 54 U/L   Alkaline Phosphatase 84 38 - 126 U/L   Total Bilirubin 0.7 0.3 - 1.2 mg/dL   GFR calc non Af Amer >60 >60 mL/min   GFR calc Af Amer >60 >60 mL/min    Comment: (NOTE) The eGFR has been calculated using the CKD EPI equation. This calculation has not been validated in all clinical situations. eGFR's persistently <60 mL/min signify possible Chronic Kidney Disease.    Anion gap 13 5 - 15    Comment: Performed at [facility], [place] [address] [place], [place] [address]  Protime-INR     Status: None   Collection Time: 08/17/17 10:43 AM  Result Value Ref Range   Prothrombin Time 13.8 11.4 - 15.2 seconds   INR 1.06     Comment: Performed at [facility], [place] [address] [place], [place] [address]  Hemoglobin A1c     Status: Abnormal   Collection Time: 08/17/17 10:50 AM  Result Value Ref Range   Hgb A1c MFr Bld 5.8 (H) 4.8 - 5.6 %    Comment: (NOTE) Pre diabetes:          5.7%-6.4% Diabetes:              >6.4% Glycemic control for   <7.0% adults with diabetes    Mean Plasma Glucose 119.76 mg/dL    Comment: Performed at [place] [address] [place], [place] [address]  Glucose, capillary     Status: Abnormal   Collection Time: 08/24/17  5:51 AM  Result Value Ref Range   Glucose-Capillary 123 (H) 65 -  99 mg/dL    Estimated body mass index is 23.74 kg/m as calculated from the following:   Height as of this encounter: 5' 3"  (1.6 m).   Weight as of this encounter: 60.8 kg (134 lb).   Imaging Review Plain radiographs demonstrate severe degenerative joint disease of the right hip(s). The bone quality appears to be fair for age and reported activity level.  Assessment/Plan:  End stage arthritis, right hip(s)  The patient history, physical examination, clinical judgement of the provider and imaging studies are consistent with end stage degenerative joint disease of the right hip(s)  and total hip arthroplasty is deemed medically necessary. The treatment options including medical management, injection therapy, arthroscopy and arthroplasty were discussed at length. The risks and benefits of total hip arthroplasty were presented and reviewed. The risks due to aseptic loosening, infection, stiffness, dislocation/subluxation,  thromboembolic complications and other imponderables were discussed.  The patient acknowledged the explanation, agreed to proceed with the plan and consent was signed. Patient is being admitted for inpatient treatment for surgery, pain control, PT, OT, prophylactic antibiotics, VTE prophylaxis, progressive ambulation and ADL's and discharge planning.The patient is planning to be discharged home with home health services

## 2017-08-24 NOTE — Op Note (Signed)
NAME:  Helen Horton, Helen Horton                       ACCOUNT NO.:  MEDICAL RECORD NO.:  [id number]  LOCATION:                                 FACILITY:  PHYSICIAN:  Harvie JuniorJohn L. Marieke Lubke, M.D.        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  PROCEDURES: 1. Right total hip replacement with a Corail stem size 12, Pinnacle     Gription cup size 48, neutral liner, and a 32-mm delta ceramic hip     ball, +0. 2. Interpretation of multiple intraoperative fluoroscopic images.  SURGEON:  Harvie JuniorJohn L. Lazer Wollard, M.D.  Threasa HeadsASSISTANOrma Flaming:  [place].  ANESTHESIA:  Spinal.  BRIEF HISTORY:  The patient is a 77 year old female with long history and significant complaints of bilateral hip pain.  She had severe radiographic changes of arthritis.  After failure of conservative care, she was taken to the operating room for right total hip replacement as the right hip was bothering her more.  We talked about treatment options, but felt that anterior approach was appropriate.  She is brought to the operating room for this procedure.  DESCRIPTION OF PROCEDURE:  The patient was brought to the operative room and after adequate anesthesia was obtained with general anesthetic, the patient was placed supine on the operating room table.  Right hip was prepped and draped in usual sterile fashion after she was placed on the Hana bed and all bony prominences were well padded.  Following this, an incision was made for an anterior approach to the hip.  Subcutaneous tissue was taken down the level of the tensor fascia.  It was then identified and a rent was made in the fascia.  The muscle was then finger dissected off the backside of the fascia  and retractors were put in place above and below the neck.  Once this was accomplished, traction was placed and the capsule was opened and tagged and following this, the provisional neck cut was made.  The head was removed.  Retractors were put in place in front and behind the acetabulum and the acetabulum was sequentially reamed to a level of 47 mm and a 48-mm pinnacle cup Gription was hammered into place, 30 degrees of anteversion and 45 degrees of lateral opening.  A neutral liner was then placed and following this, attention was turned to the stem side.  Retractors were put in place.  The hip was then externally rotated and extended and adducted and attention was turned towards opening the canal.  It was opened with chilli pepper followed by a size 8, 9, 10, and 11.  We then trialed with 11.  There was still little bit of a touch of varus of the stem and felt that we could get a little more lateralized.  At this point, we lateralized the canal, got up to a 12, calcar planed, put a +0 liner and checked the length.  She was about 7 mm long compared to our arthritic opposite side, but I though it was perfect.  Stability was perfect.  At this point, I removed the trial, put the final high offset Corail stem in with a +0 ceramic hip ball as there was not a metal 32-mm ball in the room.  Once this was done, the capsule was closed with 1 Vicryl running.  The tensor fascia was closed with 0 Vicryl running, skin with 0 and 2-0 Vicryl, and 3-0 Monocryl subcuticular.  Benzoin and Steri-Strips were applied.  Sterile compressive dressing was applied, and the patient was taken to the recovery room where she was noted to be in satisfactory condition.  The estimated blood loss for procedure was 200 mL, but the final can be gotten from the anesthetic record.     Harvie Junior, M.D.     Ranae Plumber  D:  08/24/2017  T:  08/24/2017  Job:  [id number]

## 2017-08-25 LAB — GLUCOSE, CAPILLARY
GLUCOSE-CAPILLARY: 162 mg/dL — AB (ref 65–99)
GLUCOSE-CAPILLARY: 126 mg/dL — AB (ref 65–99)
Glucose-Capillary: 106 mg/dL — ABNORMAL HIGH (ref 65–99)

## 2017-08-25 LAB — CBC
HCT: 28.7 % — ABNORMAL LOW (ref 36.0–46.0)
HEMOGLOBIN: 9.5 g/dL — AB (ref 12.0–15.0)
MCH: 28.1 pg (ref 26.0–34.0)
MCHC: 33.1 g/dL (ref 30.0–36.0)
MCV: 84.9 fL (ref 78.0–100.0)
Platelets: 268 10*3/uL (ref 150–400)
RBC: 3.38 MIL/uL — ABNORMAL LOW (ref 3.87–5.11)
RDW: 12.4 % (ref 11.5–15.5)
WBC: 12.4 10*3/uL — ABNORMAL HIGH (ref 4.0–10.5)

## 2017-08-25 NOTE — Care Management Note (Signed)
Case Management Note  Patient Details  Name: Helen Horton MRN: [id number] Date of Birth: 1940-05-31  Subjective/Objective:  Right THA                  Action/Plan: N[facility] spoke to pt and [facility]. States RW was delivered to her room on yesterday. She has bedside commode. Contacted [facility] to make aware of dc home today with HHPT. Contacted attending for HHPT orders with [facility].   Expected Discharge Date:  08/25/17               Expected Discharge Plan:  Home w Home Health Services  In-House Referral:  NA  Discharge planning Services  CM Consult  Post Acute Care Choice:  Home Health Choice offered to:  Patient  DME Arranged:  Walker rolling DME Agency:  [facility]  [facility]  [facility]  Status of Service:  Completed, signed off  If discussed at Long Length of Stay Meetings, dates discussed:    Additional Comments:  Elliot CousinShavis, Kinsley Holderman Ellen, RN 08/25/2017, 10:17 AM

## 2017-08-25 NOTE — Progress Notes (Signed)
Discharge instructions given to patient and family. D Lynnel Zanetti RN 

## 2017-08-25 NOTE — Progress Notes (Signed)
Physical Therapy Treatment Patient Details Name: Helen NationsJane Benney MRN: [id number] DOB: 1941/03/28 Today's Date: 08/25/2017    History of Present Illness 77 y.o. female admitted for R DA-THA. PMH of L4-L5 laminectomy    PT Comments    Pt progressing well with mobility, she ambulated 350' with RW, no loss of balance. Stair training completed, Pt demonstrates good understanding of HEP. She is ready to DC home from PT standpoint.   Follow Up Recommendations  Follow surgeon's recommendation for DC plan and follow-up therapies     Equipment Recommendations  Rolling walker with 5" wheels    Recommendations for Other Services       Precautions / Restrictions Precautions Precautions: Fall Precaution Comments: denies h/o falls Restrictions Weight Bearing Restrictions: No Other Position/Activity Restrictions: wbat    Mobility  Bed Mobility Overal bed mobility: Modified Independent             General bed mobility comments: HOB up  Transfers Overall transfer level: Needs assistance Equipment used: Rolling walker (2 wheeled) Transfers: Sit to/from Stand Sit to Stand: Supervision         General transfer comment: VCs hand placement  Ambulation/Gait Ambulation/Gait assistance: Supervision Ambulation Distance (Feet): 350 Feet Assistive device: Rolling walker (2 wheeled) Gait Pattern/deviations: Step-through pattern Gait velocity: WFL   General Gait Details: steady with RW, no loss of balance   Stairs Stairs: Yes   Stair Management: No rails;Backwards;Step to pattern;With walker Number of Stairs: 1 General stair comments: practiced 1 step x 3 trials with RW, VCs sequencing, min A to manage RW  Wheelchair Mobility    Modified Rankin (Stroke Patients Only)       Balance Overall balance assessment: Modified Independent                                          Cognition Arousal/Alertness: Awake/alert Behavior During Therapy: WFL for tasks  assessed/performed Overall Cognitive Status: Within Functional Limits for tasks assessed                                        Exercises Total Joint Exercises Ankle Circles/Pumps: AROM;Both;10 reps;Supine Hip ABduction/ADduction: AAROM;Right;10 reps;Standing Knee Flexion: AROM;Right;10 reps;Standing Marching in Standing: AROM;Right;10 reps;Standing Standing Hip Extension: AROM;Standing;10 reps    General Comments        Pertinent Vitals/Pain Pain Score: 2  Pain Location: R hip  Pain Descriptors / Indicators: Sore Pain Intervention(s): Limited activity within patient's tolerance;Monitored during session;Premedicated before session;Ice applied    Home Living                      Prior Function            PT Goals (current goals can now be found in the care plan section) Acute Rehab PT Goals Patient Stated Goal: to walk PT Goal Formulation: With patient/family Time For Goal Achievement: 08/31/17 Potential to Achieve Goals: Good Progress towards PT goals: Progressing toward goals    Frequency    7X/week      PT Plan Current plan remains appropriate    Co-evaluation              AM-PAC PT "6 Clicks" Daily Activity  Outcome Measure  Difficulty turning over in bed (including adjusting bedclothes, sheets and blankets)?: None Difficulty  moving from lying on back to sitting on the side of the bed? : None Difficulty sitting down on and standing up from a chair with arms (e.g., wheelchair, bedside commode, etc,.)?: None Help needed moving to and from a bed to chair (including a wheelchair)?: None Help needed walking in hospital room?: None Help needed climbing 3-5 steps with a railing? : A Little 6 Click Score: 23    End of Session Equipment Utilized During Treatment: Gait belt Activity Tolerance: Patient tolerated treatment well Patient left: in chair;with call bell/phone within reach Nurse Communication: Mobility status PT Visit  Diagnosis: Pain;Difficulty in walking, not elsewhere classified (R26.2) Pain - Right/Left: Right Pain - part of body: Hip     Time: 1610-9604 PT Time Calculation (min) (ACUTE ONLY): 31 min  Charges:  $Gait Training: 8-22 mins $Therapeutic Exercise: 8-22 mins                    G Codes:          Tamala Ser 08/25/2017, 8:41 AM [phone]

## 2017-08-25 NOTE — Discharge Summary (Signed)
Patient ID: Helen Horton MRN: [id number] DOB/AGE: 11-05-1940 77 y.o.  Admit date: 08/24/2017 Discharge date: 08/25/2017  Admission Diagnoses:  Principal Problem:   Primary osteoarthritis of right hip   Discharge Diagnoses:  Same  Past Medical History:  Diagnosis Date  . Arthritis   . Complication of anesthesia 2017   took really a long time waking up from back surgery  . Diabetes mellitus without complication (HCC)   . Hypertension     Surgeries: Procedure(s): RIGHT TOTAL HIP ARTHROPLASTY ANTERIOR APPROACH on 08/24/2017   Consultants:   Discharged Condition: Improved  Hospital Course: Helen Horton is an 77 y.o. female who was admitted 08/24/2017 for operative treatment ofPrimary osteoarthritis of right hip. Patient has severe unremitting pain that affects sleep, daily activities, and work/hobbies. After pre-op clearance the patient was taken to the operating room on 08/24/2017 and underwent  Procedure(s): RIGHT TOTAL HIP ARTHROPLASTY ANTERIOR APPROACH.    Patient was given perioperative antibiotics:  Anti-infectives (From admission, onward)   Start     Dose/Rate Route Frequency Ordered Stop   08/24/17 1430  ceFAZolin (ANCEF) IVPB 2g/100 mL premix     2 g 200 mL/hr over 30 Minutes Intravenous Every 6 hours 08/24/17 1102 08/25/17 0035   08/24/17 0624  ceFAZolin (ANCEF) IVPB 2g/100 mL premix     2 g 200 mL/hr over 30 Minutes Intravenous On call to O.R. 08/24/17 [id number] 08/24/17 0734       Patient was given sequential compression devices, early ambulation, and chemoprophylaxis to prevent DVT.  Patient benefited maximally from hospital stay and there were no complications.    Recent vital signs:  Patient Vitals for the past 24 hrs:  BP Temp Temp src Pulse Resp SpO2 Height Weight  08/25/17 0618 (!) 109/46 98 F (36.7 C) Oral 82 16 99 % - -  08/25/17 0215 (!) 108/49 98.8 F (37.1 C) Oral 90 16 99 % - -  08/24/17 2235 (!) 112/55 97.7 F (36.5 C) Oral 88 16 100 % - -  08/24/17  1834 (!) 119/51 97.9 F (36.6 C) Oral 89 16 100 % - -  08/24/17 1358 (!) 123/54 97.6 F (36.4 C) Oral 89 16 100 % - -  08/24/17 1257 (!) 115/43 (!) 97.5 F (36.4 C) Oral 91 16 100 % - -  08/24/17 1158 (!) 116/45 97.6 F (36.4 C) Oral 87 16 100 % - -  08/24/17 1050 138/60 97.8 F (36.6 C) Oral 85 16 100 % 5\' 3"  (1.6 m) 60.8 kg (134 lb)  08/24/17 1045 138/60 97.8 F (36.6 C) - 91 14 100 % - -  08/24/17 1030 138/61 97.8 F (36.6 C) - 84 15 100 % - -  08/24/17 1015 135/72 - - 85 18 100 % - -  08/24/17 1000 (!) 113/53 - - 79 17 100 % - -  08/24/17 0945 (!) 120/53 - - 81 12 100 % - -  08/24/17 0930 123/62 - - 86 15 100 % - -  08/24/17 0926 123/61 (!) 97.5 F (36.4 C) - 84 13 100 % - -     Recent laboratory studies:  Recent Labs    08/25/17 0526  WBC 12.4*  HGB 9.5*  HCT 28.7*  PLT 268     Discharge Medications:   Allergies as of 08/25/2017   No Known Allergies     Medication List    STOP taking these medications   meloxicam 15 MG tablet Commonly known as:  MOBIC     TAKE  these medications   aspirin EC 325 MG tablet Take 1 tablet (325 mg total) by mouth 2 (two) times daily after a meal. Take x 1 month post op to decrease risk of blood clots. What changed:    medication strength  how much to take  when to take this  additional instructions   docusate sodium 100 MG capsule Commonly known as:  COLACE Take 1 capsule (100 mg total) by mouth 2 (two) times daily.   doxycycline 100 MG tablet Commonly known as:  VIBRA-TABS Take 100 mg by mouth daily after supper.   ezetimibe 10 MG tablet Commonly known as:  ZETIA Take 10 mg by mouth every evening.   HYDROcodone-acetaminophen 5-325 MG tablet Commonly known as:  NORCO Take 1-2 tablets by mouth every 6 (six) hours as needed for moderate pain.   levothyroxine 88 MCG tablet Commonly known as:  SYNTHROID, LEVOTHROID Take 88 mcg by mouth daily before breakfast.   lisinopril-hydrochlorothiazide 10-12.5 MG  tablet Commonly known as:  PRINZIDE,ZESTORETIC Take 1 tablet by mouth daily before breakfast.   metFORMIN 1000 MG tablet Commonly known as:  GLUCOPHAGE Take 1,000 mg by mouth daily after breakfast.   OCUSOFT LID SCRUB EX Apply 1 applicator to eye daily as needed (for eye irritation.).   omeprazole 40 MG capsule Commonly known as:  PRILOSEC Take 40 mg by mouth daily before breakfast.   SYSTANE 0.4-0.3 % Soln Generic drug:  Polyethyl Glycol-Propyl Glycol Place 1-2 drops into both eyes 3 (three) times daily as needed (for eye irriation.).   temazepam 15 MG capsule Commonly known as:  RESTORIL Take 15 mg by mouth at bedtime.   tiZANidine 2 MG tablet Commonly known as:  ZANAFLEX Take 1 tablet (2 mg total) by mouth every 8 (eight) hours as needed for muscle spasms.   Vitamin D3 2000 units Tabs Take 2,000 Units by mouth daily before breakfast.            Durable Medical Equipment  (From admission, onward)        Start     Ordered   08/24/17 1525  For home use only DME Walker rolling  Once    Question:  Patient needs a walker to treat with the following condition  Answer:  S/P hip hemiarthroplasty   08/24/17 1524      Diagnostic Studies: Dg Chest 2 View  Result Date: 08/17/2017 CLINICAL DATA:  Hypertension.  Diabetes.  Preoperative chest x-ray. EXAM: CHEST - 2 VIEW COMPARISON:  No prior. FINDINGS: Mediastinum and hilar structures normal. Heart size normal. Mild left base subsegmental atelectasis. No pleural effusion or pneumothorax. Degenerative change thoracic spine. IMPRESSION: Mild left base subsegmental atelectasis. Electronically Signed   By: Maisie Fus  Register   On: 08/17/2017 16:14   Dg C-arm 1-60 Min-no Report  Result Date: 08/24/2017 Fluoroscopy was utilized by the requesting physician.  No radiographic interpretation.   Dg Hip Operative Unilat With Pelvis Right  Result Date: 08/24/2017 CLINICAL DATA:  Elective surgery EXAM: OPERATIVE RIGHT HIP (WITH PELVIS IF  PERFORMED) 1 VIEWS TECHNIQUE: Fluoroscopic spot image(s) were submitted for interpretation post-operatively. COMPARISON:  None. FINDINGS: There is total hip arthroplasty which is located in the AP projection. No evidence of periprosthetic fracture. Advanced contralateral hip osteoarthritis. IMPRESSION: Fluoroscopy for total hip arthroplasty.  No unexpected finding. Electronically Signed   By: Marnee Spring M.D.   On: 08/24/2017 09:21    Disposition: Discharge disposition: 01-Home or Self Care       Discharge Instructions  Call MD / Call 911   Complete by:  As directed    If you experience chest pain or shortness of breath, CALL 911 and be transported to the hospital emergency room.  If you develope a fever above 101 F, pus (white drainage) or increased drainage or redness at the wound, or calf pain, call your surgeon's office.   Constipation Prevention   Complete by:  As directed    Drink plenty of fluids.  Prune juice may be helpful.  You may use a stool softener, such as Colace (over the counter) 100 mg twice a day.  Use MiraLax (over the counter) for constipation as needed.   Diet - low sodium heart healthy   Complete by:  As directed    Discharge instructions   Complete by:  As directed    INSTRUCTIONS AFTER JOINT REPLACEMENT   Remove items at home which could result in a fall. This includes throw rugs or furniture in walking pathways ICE to the affected joint every three hours while awake for 30 minutes at a time, for at least the first 3-5 days, and then as needed for pain and swelling.  Continue to use ice for pain and swelling. You may notice swelling that will progress down to the foot and ankle.  This is normal after surgery.  Elevate your leg when you are not up walking on it.   Continue to use the breathing machine you got in the hospital (incentive spirometer) which will help keep your temperature down.  It is common for your temperature to cycle up and down following  surgery, especially at night when you are not up moving around and exerting yourself.  The breathing machine keeps your lungs expanded and your temperature down.   DIET:  As you were doing prior to hospitalization, we recommend a well-balanced diet.  DRESSING / WOUND CARE / SHOWERING  You may shower 3 days after surgery, but keep the wounds dry during showering.  You may use an occlusive plastic wrap (Press'n Seal for example), NO SOAKING/SUBMERGING IN THE BATHTUB.  If the bandage gets wet, change with a clean dry gauze.  If the incision gets wet, pat the wound dry with a clean towel.  ACTIVITY  Increase activity slowly as tolerated, but follow the weight bearing instructions below.   No driving for 6 weeks or until further direction given by your physician.  You cannot drive while taking narcotics.  No lifting or carrying greater than 10 lbs. until further directed by your surgeon. Avoid periods of inactivity such as sitting longer than an hour when not asleep. This helps prevent blood clots.  You may return to work once you are authorized by your doctor.     WEIGHT BEARING   Weight bearing as tolerated with assist device (walker, cane, etc) as directed, use it as long as suggested by your surgeon or therapist, typically at least 4-6 weeks.   EXERCISES  Results after joint replacement surgery are often greatly improved when you follow the exercise, range of motion and muscle strengthening exercises prescribed by your doctor. Safety measures are also important to protect the joint from further injury. Any time any of these exercises cause you to have increased pain or swelling, decrease what you are doing until you are comfortable again and then slowly increase them. If you have problems or questions, call your caregiver or physical therapist for advice.   Rehabilitation is important following a joint replacement. After just a few days  of immobilization, the muscles of the leg can become  weakened and shrink (atrophy).  These exercises are designed to build up the tone and strength of the thigh and leg muscles and to improve motion. Often times heat used for twenty to thirty minutes before working out will loosen up your tissues and help with improving the range of motion but do not use heat for the first two weeks following surgery (sometimes heat can increase post-operative swelling).   These exercises can be done on a training (exercise) mat, on the floor, on a table or on a bed. Use whatever works the best and is most comfortable for you.    Use music or television while you are exercising so that the exercises are a pleasant break in your day. This will make your life better with the exercises acting as a break in your routine that you can look forward to.   Perform all exercises about fifteen times, three times per day or as directed.  You should exercise both the operative leg and the other leg as well.   Exercises include:   Quad Sets - Tighten up the muscle on the front of the thigh (Quad) and hold for 5-10 seconds.   Straight Leg Raises - With your knee straight (if you were given a brace, keep it on), lift the leg to 60 degrees, hold for 3 seconds, and slowly lower the leg.  Perform this exercise against resistance later as your leg gets stronger.  Leg Slides: Lying on your back, slowly slide your foot toward your buttocks, bending your knee up off the floor (only go as far as is comfortable). Then slowly slide your foot back down until your leg is flat on the floor again.  Angel Wings: Lying on your back spread your legs to the side as far apart as you can without causing discomfort.  Hamstring Strength:  Lying on your back, push your heel against the floor with your leg straight by tightening up the muscles of your buttocks.  Repeat, but this time bend your knee to a comfortable angle, and push your heel against the floor.  You may put a pillow under the heel to make it more  comfortable if necessary.   A rehabilitation program following joint replacement surgery can speed recovery and prevent re-injury in the future due to weakened muscles. Contact your doctor or a physical therapist for more information on knee rehabilitation.    CONSTIPATION  Constipation is defined medically as fewer than three stools per week and severe constipation as less than one stool per week.  Even if you have a regular bowel pattern at home, your normal regimen is likely to be disrupted due to multiple reasons following surgery.  Combination of anesthesia, postoperative narcotics, change in appetite and fluid intake all can affect your bowels.   YOU MUST use at least one of the following options; they are listed in order of increasing strength to get the job done.  They are all available over the counter, and you may need to use some, POSSIBLY even all of these options:    Drink plenty of fluids (prune juice may be helpful) and high fiber foods Colace 100 mg by mouth twice a day  Senokot for constipation as directed and as needed Dulcolax (bisacodyl), take with full glass of water  Miralax (polyethylene glycol) once or twice a day as needed.  If you have tried all these things and are unable to have a bowel  movement in the first 3-4 days after surgery call either your surgeon or your primary doctor.    If you experience loose stools or diarrhea, hold the medications until you stool forms back up.  If your symptoms do not get better within 1 week or if they get worse, check with your doctor.  If you experience "the worst abdominal pain ever" or develop nausea or vomiting, please contact the office immediately for further recommendations for treatment.   ITCHING:  If you experience itching with your medications, try taking only a single pain pill, or even half a pain pill at a time.  You can also use Benadryl over the counter for itching or also to help with sleep.   TED HOSE STOCKINGS:   Use stockings on both legs until for at least 2 weeks or as directed by physician office. They may be removed at night for sleeping.  MEDICATIONS:  See your medication summary on the "After Visit Summary" that nursing will review with you.  You may have some home medications which will be placed on hold until you complete the course of blood thinner medication.  It is important for you to complete the blood thinner medication as prescribed.  PRECAUTIONS:  If you experience chest pain or shortness of breath - call 911 immediately for transfer to the hospital emergency department.   If you develop a fever greater that 101 F, purulent drainage from wound, increased redness or drainage from wound, foul odor from the wound/dressing, or calf pain - CONTACT YOUR SURGEON.                                                   FOLLOW-UP APPOINTMENTS:  If you do not already have a post-op appointment, please call the office for an appointment to be seen by your surgeon.  Guidelines for how soon to be seen are listed in your "After Visit Summary", but are typically between 1-4 weeks after surgery.  OTHER INSTRUCTIONS:   Knee Replacement:  Do not place pillow under knee, focus on keeping the knee straight while resting. CPM instructions: 0-90 degrees, 2 hours in the morning, 2 hours in the afternoon, and 2 hours in the evening. Place foam block, curve side up under heel at all times except when in CPM or when walking.  DO NOT modify, tear, cut, or change the foam block in any way.  MAKE SURE YOU:  Understand these instructions.  Get help right away if you are not doing well or get worse.    Thank you for letting us be a part of your medical care team.  It is a privilege we respect greatly.  We hope these instructions will help you stay on track for a fast and full recovery!   Increase activity slowly as tolerated   Complete by:  As directed       Follow-up Information    Jodi Geralds, MD. Schedule an  appointment as soon as possible for a visit in 2 weeks.   Specialty:  Orthopedic Surgery Contact information: [address] [place] [address] [phone]            Signed: Drema Halon 08/25/2017, 8:37 AM

## 2017-08-27 ENCOUNTER — Encounter (HOSPITAL_COMMUNITY): Payer: Self-pay | Admitting: Orthopedic Surgery

## 2017-11-26 ENCOUNTER — Other Ambulatory Visit: Payer: Self-pay | Admitting: Orthopedic Surgery

## 2018-01-07 NOTE — Patient Instructions (Addendum)
Helen Horton  01/07/2018   Your procedure is scheduled on: 01-18-18   [facility]    Report to Admitting at 7:30 AM    Call this number if you have problems the morning of surgery [phone]   Remember: Do not eat food or drink liquids :After Midnight.     Take these medicines the morning of surgery with A SIP OF WATER: Levothyroxine (Synthroid), and Omeprazole (Synthroid). You may also bring and use your eyedrops as needed.                                You may not have any metal on your body including hair pins and              piercings  Do not wear jewelry, make-up, lotions, powders or perfumes, deodorant             Do not wear nail polish.  Do not shave  48 hours prior to surgery.                 Do not bring valuables to the hospital. [facility] IS NOT             RESPONSIBLE   FOR VALUABLES.  Contacts, dentures or bridgework may not be worn into surgery.  Leave suitcase in the car. After surgery it may be brought to your room.     Special Instructions: N/A              Please read over the following fact sheets you were given: _____________________________________________________________________  How to Manage Your Diabetes Before and After Surgery  Why is it important to control my blood sugar before and after surgery? . Improving blood sugar levels before and after surgery helps healing and can limit problems. . A way of improving blood sugar control is eating a healthy diet by: o  Eating less sugar and carbohydrates o  Increasing activity/exercise o  Talking with your doctor about reaching your blood sugar goals . High blood sugars (greater than 180 mg/dL) can raise your risk of infections and slow your recovery, so you will need to focus on controlling your diabetes during the weeks before surgery. . Make sure that the doctor who takes care of your diabetes knows about your planned surgery including the date and  location.  How do I manage my blood sugar before surgery? . Check your blood sugar at least 4 times a day, starting 2 days before surgery, to make sure that the level is not too high or low. o Check your blood sugar the morning of your surgery when you wake up and every 2 hours until you get to the Short Stay unit. . If your blood sugar is less than 70 mg/dL, you will need to treat for low blood sugar: o Do not take insulin. o Treat a low blood sugar (less than 70 mg/dL) with  cup of clear juice (cranberry or apple), 4 glucose tablets, OR glucose gel. o Recheck blood sugar in 15 minutes after treatment (to make sure it is greater than 70 mg/dL). If your blood sugar is not greater than 70 mg/dL on recheck, call [phone] for further instructions. . Report your blood sugar to the short stay nurse when you get to Short Stay.  . If you  are admitted to the hospital after surgery: o Your blood sugar will be checked by the staff and you will probably be given insulin after surgery (instead of oral diabetes medicines) to make sure you have good blood sugar levels. o The goal for blood sugar control after surgery is 80-180 mg/dL.   WHAT DO I DO ABOUT MY DIABETES MEDICATION?  Marland Kitchen Do not take oral diabetes medicines (pills) the morning of surgery.  . THE DAY BEFORE SURGERY, take your usual dose of Metformin                    [facility] - Preparing for Surgery Before surgery, you can play an important role.  Because skin is not sterile, your skin needs to be as free of germs as possible.  You can reduce the number of germs on your skin by washing with CHG (chlorahexidine gluconate) soap before surgery.  CHG is an antiseptic cleaner which kills germs and bonds with the skin to continue killing germs even after washing. Please DO NOT use if you have an allergy to CHG or antibacterial soaps.  If your skin becomes reddened/irritated stop using the CHG and inform your nurse when you arrive at Short  Stay. Do not shave (including legs and underarms) for at least 48 hours prior to the first CHG shower.  You may shave your face/neck. Please follow these instructions carefully:  1.  Shower with CHG Soap the night before surgery and the  morning of Surgery.  2.  If you choose to wash your hair, wash your hair first as usual with your  normal  shampoo.  3.  After you shampoo, rinse your hair and body thoroughly to remove the  shampoo.                           4.  Use CHG as you would any other liquid soap.  You can apply chg directly  to the skin and wash                       Gently with a scrungie or clean washcloth.  5.  Apply the CHG Soap to your body ONLY FROM THE NECK DOWN.   Do not use on face/ open                           Wound or open sores. Avoid contact with eyes, ears mouth and genitals (private parts).                       Wash face,  Genitals (private parts) with your normal soap.             6.  Wash thoroughly, paying special attention to the area where your surgery  will be performed.  7.  Thoroughly rinse your body with warm water from the neck down.  8.  DO NOT shower/wash with your normal soap after using and rinsing off  the CHG Soap.                9.  Pat yourself dry with a clean towel.            10.  Wear clean pajamas.            11.  Place clean sheets on your bed the night of your first shower and do  not  sleep with pets. Day of Surgery : Do not apply any lotions/deodorants the morning of surgery.  Please wear clean clothes to the hospital/surgery center.  FAILURE TO FOLLOW THESE INSTRUCTIONS MAY RESULT IN THE CANCELLATION OF YOUR SURGERY PATIENT SIGNATURE_________________________________  NURSE SIGNATURE__________________________________  ________________________________________________________________________   Helen Horton  An incentive spirometer is a tool that can help keep your lungs clear and active. This tool measures how well you are  filling your lungs with each breath. Taking long deep breaths may help reverse or decrease the chance of developing breathing (pulmonary) problems (especially infection) following:  A long period of time when you are unable to move or be active. BEFORE THE PROCEDURE   If the spirometer includes an indicator to show your best effort, your nurse or respiratory therapist will set it to a desired goal.  If possible, sit up straight or lean slightly forward. Try not to slouch.  Hold the incentive spirometer in an upright position. INSTRUCTIONS FOR USE  1. Sit on the edge of your bed if possible, or sit up as far as you can in bed or on a chair. 2. Hold the incentive spirometer in an upright position. 3. Breathe out normally. 4. Place the mouthpiece in your mouth and seal your lips tightly around it. 5. Breathe in slowly and as deeply as possible, raising the piston or the ball toward the top of the column. 6. Hold your breath for 3-5 seconds or for as long as possible. Allow the piston or ball to fall to the bottom of the column. 7. Remove the mouthpiece from your mouth and breathe out normally. 8. Rest for a few seconds and repeat Steps 1 through 7 at least 10 times every 1-2 hours when you are awake. Take your time and take a few normal breaths between deep breaths. 9. The spirometer may include an indicator to show your best effort. Use the indicator as a goal to work toward during each repetition. 10. After each set of 10 deep breaths, practice coughing to be sure your lungs are clear. If you have an incision (the cut made at the time of surgery), support your incision when coughing by placing a pillow or rolled up towels firmly against it. Once you are able to get out of bed, walk around indoors and cough well. You may stop using the incentive spirometer when instructed by your caregiver.  RISKS AND COMPLICATIONS  Take your time so you do not get dizzy or light-headed.  If you are in pain,  you may need to take or ask for pain medication before doing incentive spirometry. It is harder to take a deep breath if you are having pain. AFTER USE  Rest and breathe slowly and easily.  It can be helpful to keep track of a log of your progress. Your caregiver can provide you with a simple table to help with this. If you are using the spirometer at home, follow these instructions: SEEK MEDICAL CARE IF:   You are having difficultly using the spirometer.  You have trouble using the spirometer as often as instructed.  Your pain medication is not giving enough relief while using the spirometer.  You develop fever of 100.5 F (38.1 C) or higher. SEEK IMMEDIATE MEDICAL CARE IF:   You cough up bloody sputum that had not been present before.  You develop fever of 102 F (38.9 C) or greater.  You develop worsening pain at or near the incision site. MAKE SURE YOU:  Understand these instructions.  Will watch your condition.  Will get help right away if you are not doing well or get worse. Document Released: 09/25/2006 Document Revised: 08/07/2011 Document Reviewed: 11/26/2006 [employer] Patient Information 2014 [employer], [place].   ________________________________________________________________________  WHAT IS A BLOOD TRANSFUSION? Blood Transfusion Information  A transfusion is the replacement of blood or some of its parts. Blood is made up of multiple cells which provide different functions.  Red blood cells carry oxygen and are used for blood loss replacement.  White blood cells fight against infection.  Platelets control bleeding.  Plasma helps clot blood.  Other blood products are available for specialized needs, such as hemophilia or other clotting disorders. BEFORE THE TRANSFUSION  Who gives blood for transfusions?   Healthy volunteers who are fully evaluated to make sure their blood is safe. This is blood bank blood. Transfusion therapy is the safest it has ever  been in the practice of medicine. Before blood is taken from a donor, a complete history is taken to make sure that person has no history of diseases nor engages in risky social behavior (examples are intravenous drug use or sexual activity with multiple partners). The donor's travel history is screened to minimize risk of transmitting infections, such as malaria. The donated blood is tested for signs of infectious diseases, such as HIV and hepatitis. The blood is then tested to be sure it is compatible with you in order to minimize the chance of a transfusion reaction. If you or a relative donates blood, this is often done in anticipation of surgery and is not appropriate for emergency situations. It takes many days to process the donated blood. RISKS AND COMPLICATIONS Although transfusion therapy is very safe and saves many lives, the main dangers of transfusion include:   Getting an infectious disease.  Developing a transfusion reaction. This is an allergic reaction to something in the blood you were given. Every precaution is taken to prevent this. The decision to have a blood transfusion has been considered carefully by your caregiver before blood is given. Blood is not given unless the benefits outweigh the risks. AFTER THE TRANSFUSION  Right after receiving a blood transfusion, you will usually feel much better and more energetic. This is especially true if your red blood cells have gotten low (anemic). The transfusion raises the level of the red blood cells which carry oxygen, and this usually causes an energy increase.  The nurse administering the transfusion will monitor you carefully for complications. HOME CARE INSTRUCTIONS  No special instructions are needed after a transfusion. You may find your energy is better. Speak with your caregiver about any limitations on activity for underlying diseases you may have. SEEK MEDICAL CARE IF:   Your condition is not improving after your  transfusion.  You develop redness or irritation at the intravenous (IV) site. SEEK IMMEDIATE MEDICAL CARE IF:  Any of the following symptoms occur over the next 12 hours:  Shaking chills.  You have a temperature by mouth above 102 F (38.9 C), not controlled by medicine.  Chest, back, or muscle pain.  People around you feel you are not acting correctly or are confused.  Shortness of breath or difficulty breathing.  Dizziness and fainting.  You get a rash or develop hives.  You have a decrease in urine output.  Your urine turns a dark color or changes to pink, red, or brown. Any of the following symptoms occur over the next 10 days:  You have a temperature  by mouth above 102 F (38.9 C), not controlled by medicine.  Shortness of breath.  Weakness after normal activity.  The white part of the eye turns yellow (jaundice).  You have a decrease in the amount of urine or are urinating less often.  Your urine turns a dark color or changes to pink, red, or brown. Document Released: 05/12/2000 Document Revised: 08/07/2011 Document Reviewed: 12/30/2007 [facility] Patient Information 2014 [place], [place].  _______________________________________________________________________

## 2018-01-09 ENCOUNTER — Encounter (HOSPITAL_COMMUNITY)
Admission: RE | Admit: 2018-01-09 | Discharge: 2018-01-09 | Disposition: A | Discharge status: Home / Self Care | Payer: Medicare Other | Source: Ambulatory Visit | Attending: Orthopedic Surgery | Admitting: Orthopedic Surgery

## 2018-01-09 ENCOUNTER — Encounter (INDEPENDENT_AMBULATORY_CARE_PROVIDER_SITE_OTHER): Payer: Self-pay

## 2018-01-09 ENCOUNTER — Ambulatory Visit (HOSPITAL_COMMUNITY)
Admission: RE | Admit: 2018-01-09 | Discharge: 2018-01-09 | Disposition: A | Discharge status: Home / Self Care | Payer: Medicare Other | Source: Ambulatory Visit | Attending: Orthopedic Surgery | Admitting: Orthopedic Surgery

## 2018-01-09 ENCOUNTER — Other Ambulatory Visit: Payer: Self-pay

## 2018-01-09 ENCOUNTER — Encounter (HOSPITAL_COMMUNITY): Payer: Self-pay

## 2018-01-09 DIAGNOSIS — Z01812 Encounter for preprocedural laboratory examination: Secondary | ICD-10-CM | POA: Diagnosis present

## 2018-01-09 DIAGNOSIS — Z01818 Encounter for other preprocedural examination: Secondary | ICD-10-CM | POA: Insufficient documentation

## 2018-01-09 DIAGNOSIS — M1612 Unilateral primary osteoarthritis, left hip: Secondary | ICD-10-CM | POA: Insufficient documentation

## 2018-01-09 DIAGNOSIS — Z01810 Encounter for preprocedural cardiovascular examination: Secondary | ICD-10-CM | POA: Diagnosis not present

## 2018-01-09 LAB — CBC WITH DIFFERENTIAL/PLATELET
Basophils Absolute: 0.1 10*3/uL (ref 0.0–0.1)
Basophils Relative: 1 %
EOS PCT: 2 %
Eosinophils Absolute: 0.1 10*3/uL (ref 0.0–0.7)
HEMATOCRIT: 36 % (ref 36.0–46.0)
Hemoglobin: 12 g/dL (ref 12.0–15.0)
LYMPHS ABS: 1.9 10*3/uL (ref 0.7–4.0)
LYMPHS PCT: 24 %
MCH: 26.9 pg (ref 26.0–34.0)
MCHC: 33.3 g/dL (ref 30.0–36.0)
MCV: 80.7 fL (ref 78.0–100.0)
MONO ABS: 0.8 10*3/uL (ref 0.1–1.0)
Monocytes Relative: 11 %
NEUTROS ABS: 5 10*3/uL (ref 1.7–7.7)
Neutrophils Relative %: 62 %
Platelets: 394 10*3/uL (ref 150–400)
RBC: 4.46 MIL/uL (ref 3.87–5.11)
RDW: 13.2 % (ref 11.5–15.5)
WBC: 7.9 10*3/uL (ref 4.0–10.5)

## 2018-01-09 LAB — BASIC METABOLIC PANEL
ANION GAP: 12 (ref 5–15)
BUN: 16 mg/dL (ref 8–23)
CHLORIDE: 93 mmol/L — AB (ref 98–111)
CO2: 30 mmol/L (ref 22–32)
Calcium: 9.7 mg/dL (ref 8.9–10.3)
Creatinine, Ser: 0.74 mg/dL (ref 0.44–1.00)
GFR calc Af Amer: >60 mL/min (ref >60)
GFR calc non Af Amer: >60 mL/min (ref >60)
GLUCOSE: 94 mg/dL (ref 70–99)
POTASSIUM: 3.8 mmol/L (ref 3.5–5.1)
Sodium: 135 mmol/L (ref 135–145)

## 2018-01-09 LAB — APTT: aPTT: 28 seconds (ref 24–36)

## 2018-01-09 LAB — URINALYSIS, ROUTINE W REFLEX MICROSCOPIC
BILIRUBIN URINE: NEGATIVE
GLUCOSE, UA: NEGATIVE mg/dL
Ketones, ur: NEGATIVE mg/dL
Leukocytes, UA: NEGATIVE
NITRITE: NEGATIVE
Protein, ur: NEGATIVE mg/dL
SPECIFIC GRAVITY, URINE: 1.01 (ref 1.005–1.030)
pH: 5 (ref 5.0–8.0)

## 2018-01-09 LAB — SURGICAL PCR SCREEN
MRSA, PCR: NEGATIVE
Staphylococcus aureus: NEGATIVE

## 2018-01-09 LAB — PROTIME-INR
INR: 0.94
Prothrombin Time: 12.5 seconds (ref 11.4–15.2)

## 2018-01-09 LAB — HEMOGLOBIN A1C
HEMOGLOBIN A1C: 6.4 % — AB (ref 4.8–5.6)
MEAN PLASMA GLUCOSE: 136.98 mg/dL

## 2018-01-09 LAB — GLUCOSE, CAPILLARY: Glucose-Capillary: 95 mg/dL (ref 70–99)

## 2018-01-17 MED ORDER — TRANEXAMIC ACID 1000 MG/10ML IV SOLN
2000.0000 mg | INTRAVENOUS | Status: DC
  Filled 2018-01-17: qty 20

## 2018-01-17 MED ORDER — BUPIVACAINE LIPOSOME 1.3 % IJ SUSP
20.0000 mL | INTRAMUSCULAR | Status: DC
  Filled 2018-01-17: qty 20

## 2018-01-18 ENCOUNTER — Other Ambulatory Visit: Payer: Self-pay

## 2018-01-18 ENCOUNTER — Inpatient Hospital Stay (HOSPITAL_COMMUNITY): Payer: Medicare Other

## 2018-01-18 ENCOUNTER — Inpatient Hospital Stay (HOSPITAL_COMMUNITY): Payer: Medicare Other | Admitting: Anesthesiology

## 2018-01-18 ENCOUNTER — Inpatient Hospital Stay (HOSPITAL_COMMUNITY)
Admission: RE | Admit: 2018-01-18 | Discharge: 2018-01-19 | DRG: 470 | Disposition: A | Discharge status: Home Health | Payer: Medicare Other | Attending: Orthopedic Surgery | Admitting: Orthopedic Surgery

## 2018-01-18 ENCOUNTER — Encounter (HOSPITAL_COMMUNITY)
Admission: RE | Disposition: A | Discharge status: Home / Self Care | Payer: Self-pay | Source: Home / Self Care | Attending: Orthopedic Surgery

## 2018-01-18 ENCOUNTER — Encounter (HOSPITAL_COMMUNITY): Payer: Self-pay | Admitting: Anesthesiology

## 2018-01-18 DIAGNOSIS — E119 Type 2 diabetes mellitus without complications: Secondary | ICD-10-CM | POA: Diagnosis present

## 2018-01-18 DIAGNOSIS — Z7984 Long term (current) use of oral hypoglycemic drugs: Secondary | ICD-10-CM | POA: Diagnosis not present

## 2018-01-18 DIAGNOSIS — Z79899 Other long term (current) drug therapy: Secondary | ICD-10-CM

## 2018-01-18 DIAGNOSIS — Z79890 Hormone replacement therapy (postmenopausal): Secondary | ICD-10-CM | POA: Diagnosis not present

## 2018-01-18 DIAGNOSIS — I10 Essential (primary) hypertension: Secondary | ICD-10-CM | POA: Diagnosis present

## 2018-01-18 DIAGNOSIS — Z9842 Cataract extraction status, left eye: Secondary | ICD-10-CM

## 2018-01-18 DIAGNOSIS — K219 Gastro-esophageal reflux disease without esophagitis: Secondary | ICD-10-CM | POA: Diagnosis present

## 2018-01-18 DIAGNOSIS — R55 Syncope and collapse: Secondary | ICD-10-CM | POA: Diagnosis not present

## 2018-01-18 DIAGNOSIS — M1712 Unilateral primary osteoarthritis, left knee: Secondary | ICD-10-CM | POA: Insufficient documentation

## 2018-01-18 DIAGNOSIS — Z961 Presence of intraocular lens: Secondary | ICD-10-CM | POA: Diagnosis present

## 2018-01-18 DIAGNOSIS — Z96641 Presence of right artificial hip joint: Secondary | ICD-10-CM | POA: Diagnosis present

## 2018-01-18 DIAGNOSIS — Z96649 Presence of unspecified artificial hip joint: Secondary | ICD-10-CM

## 2018-01-18 DIAGNOSIS — Z7982 Long term (current) use of aspirin: Secondary | ICD-10-CM | POA: Diagnosis not present

## 2018-01-18 DIAGNOSIS — M1612 Unilateral primary osteoarthritis, left hip: Secondary | ICD-10-CM | POA: Diagnosis present

## 2018-01-18 DIAGNOSIS — Z419 Encounter for procedure for purposes other than remedying health state, unspecified: Secondary | ICD-10-CM

## 2018-01-18 DIAGNOSIS — Z9049 Acquired absence of other specified parts of digestive tract: Secondary | ICD-10-CM | POA: Diagnosis not present

## 2018-01-18 DIAGNOSIS — Z9181 History of falling: Secondary | ICD-10-CM

## 2018-01-18 DIAGNOSIS — Z9841 Cataract extraction status, right eye: Secondary | ICD-10-CM

## 2018-01-18 HISTORY — PX: TOTAL HIP ARTHROPLASTY: SHX124

## 2018-01-18 LAB — GLUCOSE, CAPILLARY
GLUCOSE-CAPILLARY: 128 mg/dL — AB (ref 70–99)
GLUCOSE-CAPILLARY: 113 mg/dL — AB (ref 70–99)
Glucose-Capillary: 198 mg/dL — ABNORMAL HIGH (ref 70–99)
Glucose-Capillary: 225 mg/dL — ABNORMAL HIGH (ref 70–99)

## 2018-01-18 LAB — TYPE AND SCREEN
ABO/RH(D): O POS
ANTIBODY SCREEN: NEGATIVE

## 2018-01-18 SURGERY — ARTHROPLASTY, HIP, TOTAL, ANTERIOR APPROACH
Anesthesia: Spinal | Site: Hip | Laterality: Left

## 2018-01-18 MED ORDER — HYDROCODONE-ACETAMINOPHEN 5-325 MG PO TABS
1.0000 | ORAL_TABLET | ORAL | Status: DC | PRN
  Administered 2018-01-18 – 2018-01-19 (×5): 2 via ORAL
  Filled 2018-01-18 (×5): qty 2

## 2018-01-18 MED ORDER — FENTANYL CITRATE (PF) 100 MCG/2ML IJ SOLN: 25.0000 ug | INTRAMUSCULAR | Status: DC | PRN

## 2018-01-18 MED ORDER — MORPHINE SULFATE (PF) 2 MG/ML IV SOLN: 0.5000 mg | INTRAVENOUS | Status: DC | PRN

## 2018-01-18 MED ORDER — FENTANYL CITRATE (PF) 100 MCG/2ML IJ SOLN
INTRAMUSCULAR | Status: DC | PRN
  Administered 2018-01-18: 50 ug via INTRAVENOUS
  Administered 2018-01-18 (×2): 25 ug via INTRAVENOUS

## 2018-01-18 MED ORDER — CEFAZOLIN SODIUM-DEXTROSE 2-4 GM/100ML-% IV SOLN
2.0000 g | INTRAVENOUS | Status: AC
  Administered 2018-01-18: 2 g via INTRAVENOUS
  Filled 2018-01-18: qty 100

## 2018-01-18 MED ORDER — DIPHENHYDRAMINE HCL 12.5 MG/5ML PO ELIX: 12.5000 mg | ORAL_SOLUTION | ORAL | Status: DC | PRN

## 2018-01-18 MED ORDER — LISINOPRIL 10 MG PO TABS
10.0000 mg | ORAL_TABLET | Freq: Every day | ORAL | Status: DC
  Administered 2018-01-19: 10 mg via ORAL
  Filled 2018-01-18: qty 1

## 2018-01-18 MED ORDER — ALUM & MAG HYDROXIDE-SIMETH 200-200-20 MG/5ML PO SUSP: 30.0000 mL | ORAL | Status: DC | PRN

## 2018-01-18 MED ORDER — DEXAMETHASONE SODIUM PHOSPHATE 10 MG/ML IJ SOLN
10.0000 mg | Freq: Two times a day (BID) | INTRAMUSCULAR | Status: AC
  Administered 2018-01-18 – 2018-01-19 (×2): 10 mg via INTRAVENOUS
  Filled 2018-01-18 (×2): qty 1

## 2018-01-18 MED ORDER — SODIUM CHLORIDE 0.9 % IV SOLN
INTRAVENOUS | Status: DC
  Administered 2018-01-18 – 2018-01-19 (×2): via INTRAVENOUS

## 2018-01-18 MED ORDER — MEPERIDINE HCL 50 MG/ML IJ SOLN: 6.2500 mg | INTRAMUSCULAR | Status: DC | PRN

## 2018-01-18 MED ORDER — TRANEXAMIC ACID 1000 MG/10ML IV SOLN
1000.0000 mg | Freq: Once | INTRAVENOUS | Status: AC
  Administered 2018-01-18: 1000 mg via INTRAVENOUS
  Filled 2018-01-18: qty 1000

## 2018-01-18 MED ORDER — PROPOFOL 10 MG/ML IV BOLUS
INTRAVENOUS | Status: AC
  Filled 2018-01-18: qty 20

## 2018-01-18 MED ORDER — TIZANIDINE HCL 2 MG PO TABS: 2.0000 mg | ORAL_TABLET | Freq: Three times a day (TID) | ORAL | 0 refills | Status: AC | PRN

## 2018-01-18 MED ORDER — ONDANSETRON HCL 4 MG/2ML IJ SOLN
4.0000 mg | Freq: Four times a day (QID) | INTRAMUSCULAR | Status: DC | PRN
  Administered 2018-01-18: 4 mg via INTRAVENOUS
  Filled 2018-01-18: qty 2

## 2018-01-18 MED ORDER — LEVOTHYROXINE SODIUM 88 MCG PO TABS
88.0000 ug | ORAL_TABLET | Freq: Every day | ORAL | Status: DC
  Administered 2018-01-19: 88 ug via ORAL
  Filled 2018-01-18: qty 1

## 2018-01-18 MED ORDER — TRANEXAMIC ACID 1000 MG/10ML IV SOLN
1000.0000 mg | INTRAVENOUS | Status: AC
  Administered 2018-01-18: 1000 mg via INTRAVENOUS
  Filled 2018-01-18: qty 10

## 2018-01-18 MED ORDER — METHOCARBAMOL 500 MG IVPB - SIMPLE MED
500.0000 mg | Freq: Four times a day (QID) | INTRAVENOUS | Status: DC | PRN
  Filled 2018-01-18: qty 50

## 2018-01-18 MED ORDER — BUPIVACAINE LIPOSOME 1.3 % IJ SUSP
INTRAMUSCULAR | Status: DC | PRN
  Administered 2018-01-18: 20 mL

## 2018-01-18 MED ORDER — PHENYLEPHRINE 40 MCG/ML (10ML) SYRINGE FOR IV PUSH (FOR BLOOD PRESSURE SUPPORT)
PREFILLED_SYRINGE | INTRAVENOUS | Status: AC
  Filled 2018-01-18: qty 10

## 2018-01-18 MED ORDER — HYDROCODONE-ACETAMINOPHEN 7.5-325 MG PO TABS: 1.0000 | ORAL_TABLET | ORAL | Status: DC | PRN

## 2018-01-18 MED ORDER — MIDAZOLAM HCL 2 MG/2ML IJ SOLN
INTRAMUSCULAR | Status: AC
  Filled 2018-01-18: qty 2

## 2018-01-18 MED ORDER — DOCUSATE SODIUM 100 MG PO CAPS
100.0000 mg | ORAL_CAPSULE | Freq: Two times a day (BID) | ORAL | Status: DC
  Administered 2018-01-18 – 2018-01-19 (×2): 100 mg via ORAL
  Filled 2018-01-18 (×2): qty 1

## 2018-01-18 MED ORDER — HYDROCODONE-ACETAMINOPHEN 5-325 MG PO TABS: 1.0000 | ORAL_TABLET | Freq: Four times a day (QID) | ORAL | 0 refills | Status: AC | PRN

## 2018-01-18 MED ORDER — ASPIRIN EC 325 MG PO TBEC: 325.0000 mg | DELAYED_RELEASE_TABLET | Freq: Two times a day (BID) | ORAL | 0 refills | Status: AC

## 2018-01-18 MED ORDER — TEMAZEPAM 15 MG PO CAPS
15.0000 mg | ORAL_CAPSULE | Freq: Every day | ORAL | Status: DC
  Administered 2018-01-18: 15 mg via ORAL
  Filled 2018-01-18: qty 1

## 2018-01-18 MED ORDER — METFORMIN HCL 500 MG PO TABS
1000.0000 mg | ORAL_TABLET | Freq: Every day | ORAL | Status: DC
  Administered 2018-01-19: 1000 mg via ORAL
  Filled 2018-01-18: qty 2

## 2018-01-18 MED ORDER — GABAPENTIN 300 MG PO CAPS
300.0000 mg | ORAL_CAPSULE | Freq: Two times a day (BID) | ORAL | Status: DC
  Administered 2018-01-18 – 2018-01-19 (×2): 300 mg via ORAL
  Filled 2018-01-18 (×2): qty 1

## 2018-01-18 MED ORDER — PROPOFOL 10 MG/ML IV BOLUS
INTRAVENOUS | Status: AC
  Filled 2018-01-18: qty 60

## 2018-01-18 MED ORDER — PHENYLEPHRINE 40 MCG/ML (10ML) SYRINGE FOR IV PUSH (FOR BLOOD PRESSURE SUPPORT)
PREFILLED_SYRINGE | INTRAVENOUS | Status: DC | PRN
  Administered 2018-01-18 (×15): 80 ug via INTRAVENOUS

## 2018-01-18 MED ORDER — BUPIVACAINE-EPINEPHRINE 0.25% -1:200000 IJ SOLN
INTRAMUSCULAR | Status: DC | PRN
  Administered 2018-01-18: 30 mL

## 2018-01-18 MED ORDER — PROPOFOL 10 MG/ML IV BOLUS
INTRAVENOUS | Status: DC | PRN
  Administered 2018-01-18: 30 mg via INTRAVENOUS
  Administered 2018-01-18: 20 mg via INTRAVENOUS

## 2018-01-18 MED ORDER — ONDANSETRON HCL 4 MG/2ML IJ SOLN
INTRAMUSCULAR | Status: DC | PRN
  Administered 2018-01-18: 4 mg via INTRAVENOUS

## 2018-01-18 MED ORDER — ASPIRIN EC 325 MG PO TBEC
325.0000 mg | DELAYED_RELEASE_TABLET | Freq: Two times a day (BID) | ORAL | Status: DC
  Administered 2018-01-18 – 2018-01-19 (×2): 325 mg via ORAL
  Filled 2018-01-18 (×2): qty 1

## 2018-01-18 MED ORDER — INSULIN ASPART 100 UNIT/ML ~~LOC~~ SOLN
0.0000 [IU] | Freq: Three times a day (TID) | SUBCUTANEOUS | Status: DC
  Administered 2018-01-19 (×2): 3 [IU] via SUBCUTANEOUS

## 2018-01-18 MED ORDER — LISINOPRIL-HYDROCHLOROTHIAZIDE 10-12.5 MG PO TABS: 1.0000 | ORAL_TABLET | Freq: Every day | ORAL | Status: DC

## 2018-01-18 MED ORDER — DEXAMETHASONE SODIUM PHOSPHATE 10 MG/ML IJ SOLN
INTRAMUSCULAR | Status: DC | PRN
  Administered 2018-01-18: 10 mg via INTRAVENOUS

## 2018-01-18 MED ORDER — HYDROCHLOROTHIAZIDE 12.5 MG PO CAPS
12.5000 mg | ORAL_CAPSULE | Freq: Every day | ORAL | Status: DC
  Administered 2018-01-19: 12.5 mg via ORAL
  Filled 2018-01-18: qty 1

## 2018-01-18 MED ORDER — CEFAZOLIN SODIUM-DEXTROSE 2-4 GM/100ML-% IV SOLN
2.0000 g | Freq: Four times a day (QID) | INTRAVENOUS | Status: AC
  Administered 2018-01-18 (×2): 2 g via INTRAVENOUS
  Filled 2018-01-18 (×2): qty 100

## 2018-01-18 MED ORDER — BUPIVACAINE-EPINEPHRINE (PF) 0.25% -1:200000 IJ SOLN
INTRAMUSCULAR | Status: AC
  Filled 2018-01-18: qty 30

## 2018-01-18 MED ORDER — BUPIVACAINE IN DEXTROSE 0.75-8.25 % IT SOLN
INTRATHECAL | Status: DC | PRN
  Administered 2018-01-18: 1.8 mL via INTRATHECAL

## 2018-01-18 MED ORDER — SODIUM CHLORIDE 0.9 % IR SOLN
Status: DC | PRN
  Administered 2018-01-18: 1000 mL

## 2018-01-18 MED ORDER — GLYCOPYRROLATE PF 0.2 MG/ML IJ SOSY
PREFILLED_SYRINGE | INTRAMUSCULAR | Status: AC
  Filled 2018-01-18: qty 1

## 2018-01-18 MED ORDER — MAGNESIUM CITRATE PO SOLN: 1.0000 | Freq: Once | ORAL | Status: DC | PRN

## 2018-01-18 MED ORDER — CHLORHEXIDINE GLUCONATE 4 % EX LIQD: 60.0000 mL | Freq: Once | CUTANEOUS | Status: DC

## 2018-01-18 MED ORDER — GLYCOPYRROLATE PF 0.2 MG/ML IJ SOSY
PREFILLED_SYRINGE | INTRAMUSCULAR | Status: DC | PRN
  Administered 2018-01-18: .2 mg via INTRAVENOUS

## 2018-01-18 MED ORDER — METHOCARBAMOL 500 MG PO TABS
500.0000 mg | ORAL_TABLET | Freq: Four times a day (QID) | ORAL | Status: DC | PRN
  Administered 2018-01-18 – 2018-01-19 (×3): 500 mg via ORAL
  Filled 2018-01-18 (×3): qty 1

## 2018-01-18 MED ORDER — LACTATED RINGERS IV SOLN
INTRAVENOUS | Status: DC
  Administered 2018-01-18 (×3): via INTRAVENOUS

## 2018-01-18 MED ORDER — LACTATED RINGERS IV SOLN: INTRAVENOUS | Status: DC

## 2018-01-18 MED ORDER — MIDAZOLAM HCL 5 MG/5ML IJ SOLN
INTRAMUSCULAR | Status: DC | PRN
  Administered 2018-01-18: 1 mg via INTRAVENOUS
  Administered 2018-01-18 (×2): 0.5 mg via INTRAVENOUS

## 2018-01-18 MED ORDER — PANTOPRAZOLE SODIUM 40 MG PO TBEC
80.0000 mg | DELAYED_RELEASE_TABLET | Freq: Every day | ORAL | Status: DC
  Administered 2018-01-19: 80 mg via ORAL
  Filled 2018-01-18: qty 2

## 2018-01-18 MED ORDER — CELECOXIB 200 MG PO CAPS
200.0000 mg | ORAL_CAPSULE | Freq: Two times a day (BID) | ORAL | Status: DC
  Administered 2018-01-18 – 2018-01-19 (×2): 200 mg via ORAL
  Filled 2018-01-18 (×2): qty 1

## 2018-01-18 MED ORDER — BISACODYL 5 MG PO TBEC: 5.0000 mg | DELAYED_RELEASE_TABLET | Freq: Every day | ORAL | Status: DC | PRN

## 2018-01-18 MED ORDER — FENTANYL CITRATE (PF) 100 MCG/2ML IJ SOLN
INTRAMUSCULAR | Status: AC
  Filled 2018-01-18: qty 2

## 2018-01-18 MED ORDER — ACETAMINOPHEN 325 MG PO TABS: 325.0000 mg | ORAL_TABLET | Freq: Four times a day (QID) | ORAL | Status: DC | PRN

## 2018-01-18 MED ORDER — PROPOFOL 500 MG/50ML IV EMUL
INTRAVENOUS | Status: DC | PRN
  Administered 2018-01-18: 50 ug/kg/min via INTRAVENOUS

## 2018-01-18 MED ORDER — ONDANSETRON HCL 4 MG PO TABS: 4.0000 mg | ORAL_TABLET | Freq: Four times a day (QID) | ORAL | Status: DC | PRN

## 2018-01-18 MED ORDER — PROMETHAZINE HCL 25 MG/ML IJ SOLN: 6.2500 mg | INTRAMUSCULAR | Status: DC | PRN

## 2018-01-18 MED ORDER — POLYETHYLENE GLYCOL 3350 17 G PO PACK: 17.0000 g | PACK | Freq: Every day | ORAL | Status: DC | PRN

## 2018-01-18 SURGICAL SUPPLY — 41 items
BAG ZIPLOCK 12X15 (MISCELLANEOUS) IMPLANT
BENZOIN TINCTURE PRP APPL 2/3 (GAUZE/BANDAGES/DRESSINGS) ×3 IMPLANT
BLADE SAW SGTL 18X1.27X75 (BLADE) ×2 IMPLANT
BLADE SAW SGTL 18X1.27X75MM (BLADE) ×1
BNDG COHESIVE 6X5 TAN STRL LF (GAUZE/BANDAGES/DRESSINGS) IMPLANT
CELLS DAT CNTRL 66122 CELL SVR (MISCELLANEOUS) IMPLANT
CLOSURE WOUND 1/2 X4 (GAUZE/BANDAGES/DRESSINGS) ×1
COVER PERINEAL POST (MISCELLANEOUS) ×3 IMPLANT
COVER SURGICAL LIGHT HANDLE (MISCELLANEOUS) ×3 IMPLANT
CUP ACETBLR 48 OD 100 SERIES (Hips) ×3 IMPLANT
DRAPE STERI IOBAN 125X83 (DRAPES) ×3 IMPLANT
DRAPE U-SHAPE 47X51 STRL (DRAPES) ×6 IMPLANT
DRSG AQUACEL AG ADV 3.5X 6 (GAUZE/BANDAGES/DRESSINGS) ×3 IMPLANT
DRSG AQUACEL AG ADV 3.5X10 (GAUZE/BANDAGES/DRESSINGS) IMPLANT
DURAPREP 26ML APPLICATOR (WOUND CARE) ×3 IMPLANT
ELECT REM PT RETURN 15FT ADLT (MISCELLANEOUS) ×3 IMPLANT
ELIMINATOR HOLE APEX DEPUY (Hips) ×3 IMPLANT
GAUZE XEROFORM 1X8 LF (GAUZE/BANDAGES/DRESSINGS) IMPLANT
GLOVE BIOGEL PI IND STRL 8 (GLOVE) ×2 IMPLANT
GLOVE BIOGEL PI INDICATOR 8 (GLOVE) ×4
GLOVE ECLIPSE 7.5 STRL STRAW (GLOVE) ×6 IMPLANT
GOWN STRL REUS W/TWL XL LVL3 (GOWN DISPOSABLE) ×12 IMPLANT
HEAD FEM STD 32X+9 STRL (Hips) ×3 IMPLANT
HOLDER FOLEY CATH W/STRAP (MISCELLANEOUS) ×3 IMPLANT
HOOD PEEL AWAY FLYTE STAYCOOL (MISCELLANEOUS) ×6 IMPLANT
LINER ACET 32X48 (Liner) ×3 IMPLANT
NEEDLE HYPO 22GX1.5 SAFETY (NEEDLE) ×3 IMPLANT
PACK ANTERIOR HIP CUSTOM (KITS) ×3 IMPLANT
RTRCTR WOUND ALEXIS 18CM MED (MISCELLANEOUS)
STAPLER VISISTAT 35W (STAPLE) IMPLANT
STEM CORAIL KA11 (Stem) ×3 IMPLANT
STRIP CLOSURE SKIN 1/2X4 (GAUZE/BANDAGES/DRESSINGS) ×2 IMPLANT
SUT ETHIBOND NAB CT1 #1 30IN (SUTURE) ×6 IMPLANT
SUT MNCRL AB 3-0 PS2 18 (SUTURE) ×3 IMPLANT
SUT VIC AB 0 CT1 36 (SUTURE) ×3 IMPLANT
SUT VIC AB 1 CT1 36 (SUTURE) ×6 IMPLANT
SUT VIC AB 2-0 CT1 27 (SUTURE) ×4
SUT VIC AB 2-0 CT1 TAPERPNT 27 (SUTURE) ×2 IMPLANT
TRAY FOLEY MTR SLVR 14FR STAT (SET/KITS/TRAYS/PACK) ×3 IMPLANT
TRAY FOLEY MTR SLVR 16FR STAT (SET/KITS/TRAYS/PACK) IMPLANT
YANKAUER SUCT BULB TIP NO VENT (SUCTIONS) ×3 IMPLANT

## 2018-01-18 NOTE — Anesthesia Preprocedure Evaluation (Addendum)
Anesthesia Evaluation  Patient identified by MRN, date of birth, ID band Patient awake    Reviewed: Allergy & Precautions, NPO status , Patient's Chart, lab work & pertinent test results  Airway Mallampati: I  TM Distance: >3 FB Neck ROM: Full    Dental  (+) Teeth Intact, Dental Advisory Given   Pulmonary neg pulmonary ROS,    breath sounds clear to auscultation       Cardiovascular hypertension, Pt. on medications  Rhythm:Regular Rate:Normal     Neuro/Psych negative neurological ROS  negative psych ROS   GI/Hepatic Neg liver ROS, GERD  Medicated,  Endo/Other  diabetes, Type 2, Oral Hypoglycemic Agents  Renal/GU negative Renal ROS     Musculoskeletal  (+) Arthritis , Osteoarthritis,    Abdominal Normal abdominal exam  (+)   Peds  Hematology negative hematology ROS (+)   Anesthesia Other Findings   Reproductive/Obstetrics                            Lab Results  Component Value Date   WBC 7.9 01/09/2018   HGB 12.0 01/09/2018   HCT 36.0 01/09/2018   MCV 80.7 01/09/2018   PLT 394 01/09/2018   Lab Results  Component Value Date   CREATININE 0.74 01/09/2018   BUN 16 01/09/2018   NA 135 01/09/2018   K 3.8 01/09/2018   CL 93 (L) 01/09/2018   CO2 30 01/09/2018   Lab Results  Component Value Date   INR 0.94 01/09/2018   INR 1.06 08/17/2017     Anesthesia Physical Anesthesia Plan  ASA: II  Anesthesia Plan: Spinal   Post-op Pain Management:    Induction: Intravenous  PONV Risk Score and Plan: 3 and Ondansetron, Dexamethasone and Propofol infusion  Airway Management Planned: Simple Face Mask  Additional Equipment: None  Intra-op Plan:   Post-operative Plan:   Informed Consent: I have reviewed the patients History and Physical, chart, labs and discussed the procedure including the risks, benefits and alternatives for the proposed anesthesia with the patient or authorized  representative who has indicated his/her understanding and acceptance.   Dental advisory given  Plan Discussed with: CRNA  Anesthesia Plan Comments:        Anesthesia Quick Evaluation

## 2018-01-18 NOTE — Discharge Instructions (Signed)

## 2018-01-18 NOTE — Anesthesia Postprocedure Evaluation (Signed)
Anesthesia Post Note  Patient: Nolon NationsJane Adsit  Procedure(s) Performed: LEFT TOTAL HIP ARTHROPLASTY ANTERIOR APPROACH (Left Hip)     Patient location during evaluation: PACU Anesthesia Type: Spinal Level of consciousness: oriented and awake and alert Pain management: pain level controlled Vital Signs Assessment: post-procedure vital signs reviewed and stable Respiratory status: spontaneous breathing, respiratory function stable and patient connected to nasal cannula oxygen Cardiovascular status: blood pressure returned to baseline and stable Postop Assessment: no headache, no backache, no apparent nausea or vomiting, spinal receding and patient able to bend at knees Anesthetic complications: no    Last Vitals:  Vitals:   01/18/18 0754 01/18/18 1123  BP: (!) 167/68 (!) 110/57  Pulse: 79 96  Resp: 18 15  Temp: 36.6 C 36.6 C  SpO2: 100% 99%    Last Pain:  Vitals:   01/18/18 0754  TempSrc: Oral                 Shelton SilvasKevin D Hollis

## 2018-01-18 NOTE — H&P (Addendum)
TOTAL HIP ADMISSION H&P  Patient is admitted for left total hip arthroplasty.  Subjective:  Chief Complaint: left hip pain  HPI: Helen Horton, 77 y.o. female, has a history of pain and functional disability in the left hip(s) due to arthritis and patient has failed non-surgical conservative treatments for greater than 12 weeks to include NSAID's and/or analgesics, flexibility and strengthening excercises, supervised PT with diminished ADL's post treatment and activity modification.  Onset of symptoms was gradual starting 4 years ago with gradually worsening course since that time.The patient noted no past surgery on the left hip(s).  Patient currently rates pain in the left hip at 8 out of 10 with activity. Patient has night pain, worsening of pain with activity and weight bearing, trendelenberg gait, pain with passive range of motion and joint swelling. Patient has evidence of subchondral sclerosis, periarticular osteophytes, joint subluxation and joint space narrowing by imaging studies. This condition presents safety issues increasing the risk of falls. This patient has had Failure of all reasonable conservative care.  There is no current active infection.  Patient Active Problem List   Diagnosis Date Noted  . Primary osteoarthritis of right hip 08/24/2017   Past Medical History:  Diagnosis Date  . Arthritis   . Complication of anesthesia 2017   took really a long time waking up from back surgery  . Diabetes mellitus without complication ([place])   . Hypertension     Past Surgical History:  Procedure Laterality Date  . BACK SURGERY  2017   lumbar L4-5 laminectomy  . CHOLECYSTECTOMY    . EYE SURGERY     bilateral cataract with lens implant  . TONSILLECTOMY    . TOTAL HIP ARTHROPLASTY Right 08/24/2017   Procedure: RIGHT TOTAL HIP ARTHROPLASTY ANTERIOR APPROACH;  Surgeon: Dorna Leitz, MD;  Location: [facility];  Service: Orthopedics;  Laterality: Right;    Current Facility-Administered  Medications  Medication Dose Route Frequency Provider Last Rate Last Dose  . bupivacaine liposome (EXPAREL) 1.3 % injection 266 mg  20 mL Infiltration On Call to OR Dorna Leitz, MD      . tranexamic acid (CYKLOKAPRON) 2,000 mg in sodium chloride 0.9 % 50 mL Topical Application  6,384 mg Topical On Call to OR Dorna Leitz, MD       Current Outpatient Medications  Medication Sig Dispense Refill Last Dose  . acetaminophen (TYLENOL) 500 MG tablet Take 1,000 mg by mouth every 6 (six) hours as needed for moderate pain or headache.     Marland Kitchen aspirin EC 81 MG tablet Take 81 mg by mouth daily before breakfast.     . Carboxymethylcellulose Sodium (THERATEARS) 0.25 % SOLN Place 2 drops into both eyes daily as needed (for dry eyes).     . Cholecalciferol (VITAMIN D3) 2000 units TABS Take 2,000 Units by mouth daily before breakfast.   08/23/2017 at Unknown time  . levothyroxine (SYNTHROID, LEVOTHROID) 88 MCG tablet Take 88 mcg by mouth daily before breakfast.   08/24/2017 at 0430  . lisinopril-hydrochlorothiazide (PRINZIDE,ZESTORETIC) 10-12.5 MG tablet Take 1 tablet by mouth daily before breakfast.    08/23/2017 at Unknown time  . metFORMIN (GLUCOPHAGE) 1000 MG tablet Take 1,000 mg by mouth daily after breakfast.   08/23/2017 at Unknown time  . omeprazole (PRILOSEC) 40 MG capsule Take 40 mg by mouth daily before breakfast.   08/24/2017 at 04300  . temazepam (RESTORIL) 15 MG capsule Take 15 mg by mouth at bedtime.   08/23/2017 at Unknown time  . docusate sodium (  COLACE) 100 MG capsule Take 1 capsule (100 mg total) by mouth 2 (two) times daily. (Patient not taking: Reported on 01/03/2018) 30 capsule 0 Not Taking at Unknown time  . HYDROcodone-acetaminophen (NORCO) 5-325 MG tablet Take 1-2 tablets by mouth every 6 (six) hours as needed for moderate pain. (Patient not taking: Reported on 01/03/2018) 40 tablet 0 Not Taking at Unknown time  . tiZANidine (ZANAFLEX) 2 MG tablet Take 1 tablet (2 mg total) by mouth every 8 (eight)  hours as needed for muscle spasms. (Patient not taking: Reported on 01/03/2018) 50 tablet 0 Not Taking at Unknown time   No Known Allergies  Social History   Tobacco Use  . Smoking status: Never Smoker  . Smokeless tobacco: Never Used  Substance Use Topics  . Alcohol use: Yes    Comment: rarely wine    History reviewed. No pertinent family history.   ROS ROS: I have reviewed the patient's review of systems thoroughly and there are no positive responses as relates to the HPI. Objective:  Physical Exam  Vital signs in last 24 hours:   Well-developed well-nourished patient in no acute distress. Alert and oriented x3 HEENT:within normal limits Cardiac: Regular rate and rhythm Pulmonary: Lungs clear to auscultation Abdomen: Soft and nontender.  Normal active bowel sounds  Musculoskeletal: (Left hip: Painful range of motion.  Limited range of motion.  No instability.  Neurovascular intact distally. Labs: Recent Results (from the past 2160 hour(s))  Urinalysis, Routine w reflex microscopic     Status: Abnormal   Collection Time: 01/09/18 10:54 AM  Result Value Ref Range   Color, Urine STRAW (A) YELLOW   APPearance CLEAR CLEAR   Specific Gravity, Urine 1.010 1.005 - 1.030   pH 5.0 5.0 - 8.0   Glucose, UA NEGATIVE NEGATIVE mg/dL   Hgb urine dipstick SMALL (A) NEGATIVE   Bilirubin Urine NEGATIVE NEGATIVE   Ketones, ur NEGATIVE NEGATIVE mg/dL   Protein, ur NEGATIVE NEGATIVE mg/dL   Nitrite NEGATIVE NEGATIVE   Leukocytes, UA NEGATIVE NEGATIVE   RBC / HPF 0-5 0 - 5 RBC/hpf   WBC, UA 0-5 0 - 5 WBC/hpf   Bacteria, UA RARE (A) NONE SEEN   Mucus PRESENT     Comment: Performed at [facility], [place] [address] [place], [place] [address]  Glucose, capillary     Status: None   Collection Time: 01/09/18 11:16 AM  Result Value Ref Range   Glucose-Capillary 95 70 - 99 mg/dL  APTT     Status: None   Collection Time: 01/09/18 11:36 AM  Result Value Ref Range   aPTT 28  24 - 36 seconds    Comment: Performed at [facility], [place] [address], [place], [place] [address]  Basic metabolic panel     Status: Abnormal   Collection Time: 01/09/18 11:36 AM  Result Value Ref Range   Sodium 135 135 - 145 mmol/L   Potassium 3.8 3.5 - 5.1 mmol/L   Chloride 93 (L) 98 - 111 mmol/L   CO2 30 22 - 32 mmol/L   Glucose, Bld 94 70 - 99 mg/dL   BUN 16 8 - 23 mg/dL   Creatinine, Ser 0.74 0.44 - 1.00 mg/dL   Calcium 9.7 8.9 - 10.3 mg/dL   GFR calc non Af Amer >60 >60 mL/min   GFR calc Af Amer >60 >60 mL/min    Comment: (NOTE) The eGFR has been calculated using the CKD EPI equation. This calculation has not been  validated in all clinical situations. eGFR's persistently <60 mL/min signify possible Chronic Kidney Disease.    Anion gap 12 5 - 15    Comment: Performed at [facility], [place] [address] [place], [place] [address]  CBC WITH DIFFERENTIAL     Status: None   Collection Time: 01/09/18 11:36 AM  Result Value Ref Range   WBC 7.9 4.0 - 10.5 K/uL   RBC 4.46 3.87 - 5.11 MIL/uL   Hemoglobin 12.0 12.0 - 15.0 g/dL   HCT 36.0 36.0 - 46.0 %   MCV 80.7 78.0 - 100.0 fL   MCH 26.9 26.0 - 34.0 pg   MCHC 33.3 30.0 - 36.0 g/dL   RDW 13.2 11.5 - 15.5 %   Platelets 394 150 - 400 K/uL   Neutrophils Relative % 62 %   Neutro Abs 5.0 1.7 - 7.7 K/uL   Lymphocytes Relative 24 %   Lymphs Abs 1.9 0.7 - 4.0 K/uL   Monocytes Relative 11 %   Monocytes Absolute 0.8 0.1 - 1.0 K/uL   Eosinophils Relative 2 %   Eosinophils Absolute 0.1 0.0 - 0.7 K/uL   Basophils Relative 1 %   Basophils Absolute 0.1 0.0 - 0.1 K/uL    Comment: Performed at [facility], [place] [address] [place], [place] [address]  Protime-INR     Status: None   Collection Time: 01/09/18 11:36 AM  Result Value Ref Range   Prothrombin Time 12.5 11.4 - 15.2 seconds   INR 0.94     Comment: Performed at [facility], [place] [address], [place],  [place] [address]  Type and screen Order type and screen if day of surgery is less than 15 days from draw of preadmission visit or order morning of surgery if day of surgery is greater than 6 days from preadmission visit.     Status: None   Collection Time: 01/09/18 11:36 AM  Result Value Ref Range   ABO/RH(D) O POS    Antibody Screen NEG    Sample Expiration 01/23/2018    Extend sample reason      NO TRANSFUSIONS OR PREGNANCY IN THE PAST 3 MONTHS Performed at [facility], [place] [address] [place], [place] [address]   Surgical pcr screen     Status: None   Collection Time: 01/09/18 11:36 AM  Result Value Ref Range   MRSA, PCR NEGATIVE NEGATIVE   Staphylococcus aureus NEGATIVE NEGATIVE    Comment: (NOTE) The Xpert SA Assay (FDA approved for NASAL specimens in patients 72 years of age and older), is one component of a comprehensive surveillance program. It is not intended to diagnose infection nor to guide or monitor treatment. Performed at [facility], [place] [address], [place], [place] [address]   Hemoglobin A1c     Status: Abnormal   Collection Time: 01/09/18 11:36 AM  Result Value Ref Range   Hgb A1c MFr Bld 6.4 (H) 4.8 - 5.6 %    Comment: (NOTE) Pre diabetes:          5.7%-6.4% Diabetes:              >6.4% Glycemic control for   <7.0% adults with diabetes    Mean Plasma Glucose 136.98 mg/dL    Comment: Performed at [place] [address] [place], [place] [address]    Estimated body mass index is 24.15 kg/m as calculated from the following:   Height as of 01/09/18: 5' 2.25" (  1.581 m).   Weight as of 01/09/18: 60.4 kg.   Imaging Review Plain radiographs demonstrate severe degenerative joint disease of the left hip(s). The bone quality appears to be fair for age and reported activity level.    Preoperative templating of the joint replacement has been completed, documented, and submitted to the Operating Room personnel in order to  optimize intra-operative equipment management.     Assessment/Plan:  End stage arthritis, left hip(s)  The patient history, physical examination, clinical judgement of the provider and imaging studies are consistent with end stage degenerative joint disease of the left hip(s) and total hip arthroplasty is deemed medically necessary. The treatment options including medical management, injection therapy, arthroscopy and arthroplasty were discussed at length. The risks and benefits of total hip arthroplasty were presented and reviewed. The risks due to aseptic loosening, infection, stiffness, dislocation/subluxation,  thromboembolic complications and other imponderables were discussed.  The patient acknowledged the explanation, agreed to proceed with the plan and consent was signed. Patient is being admitted for inpatient treatment for surgery, pain control, PT, OT, prophylactic antibiotics, VTE prophylaxis, progressive ambulation and ADL's and discharge planning.The patient is planning to be discharged home with home health services

## 2018-01-18 NOTE — Progress Notes (Signed)
[facility] on call MD was paged through the answering service regarding the pt's order to D/C foley catheter after spinal wears off today. As soon as the pt's spinal wore off, PT began to work with the pt. When the pt dangled, PT reported that the pt experienced a suspected 10-second syncopal episode.   Janee Mornhompson, MD called back and advised to leave foley in until 0600 8/24.

## 2018-01-18 NOTE — Evaluation (Signed)
Physical Therapy Evaluation Patient Details Name: Helen Horton MRN: [id number] DOB: [birth year] Today's Date: 01/18/2018   History of Present Illness  77 YO female s/p L DA-THA on 01/18/18. PMH includes arthritis, DM, HTN, lumbar laminectomy L4-L5, bilat cataracts, R DA-THA.   Clinical Impression   Pt is a 77 YO female s/p LDATHA on 01/18/18. PMH as listed above. Pt presents with L hip pain 2/10, difficulty performing bed mobility/transfers/ambulation, and decreased activity tolerance. Pt also with high BP sitting EOB after standing (153/136) and a period of pt not responding to PT with eyes wide open, suspected 10-second syncopal episode. Pt disappointed by not being able to ambulate today. Pt highly motivated to be d/c home tomorrow and would like to walk as soon as possible. Will continue to follow acutely and check back in with pt as schedule permits.     Follow Up Recommendations Follow surgeon's recommendation for DC plan and follow-up therapies;Supervision for mobility/OOB    Equipment Recommendations  None recommended by PT    Recommendations for Other Services       Precautions / Restrictions Precautions Precautions: Fall Restrictions Weight Bearing Restrictions: No Other Position/Activity Restrictions: WBAT       Mobility  Bed Mobility Overal bed mobility: Needs Assistance Bed Mobility: Supine to Sit;Sit to Supine;Rolling Rolling: Min assist(when placing pad underneath pt )   Supine to sit: Min assist Sit to supine: Min assist   General bed mobility comments: Min assist for LLE management and scooting to EOB. verbal cuing for using RLE to assist with scooting to EOB.   Transfers Overall transfer level: Needs assistance Equipment used: Rolling walker (2 wheeled) Transfers: Sit to/from Stand Sit to Stand: Min assist         General transfer comment: Min assist for power up, steadying. Pt with feelings of dizziness and nausea upon standing, pt immediately sat back  down. BP sitting EOB after standing was 152/136. Pt with period of not responding, eyes wide open sitting EOB lasting approximately 10 seconds.   Ambulation/Gait Ambulation/Gait assistance: (unable to ambulate given pt's apparent period of faintness)              Stairs            Wheelchair Mobility    Modified Rankin (Stroke Patients Only)       Balance Overall balance assessment: Mild deficits observed, not formally tested                                           Pertinent Vitals/Pain Pain Assessment: 0-10 Pain Score: 3  Pain Descriptors / Indicators: Sore;Operative site guarding Pain Intervention(s): Limited activity within patient's tolerance;Ice applied;Monitored during session    Home Living Family/patient expects to be discharged to:: Private residence Living Arrangements: Alone Available Help at Discharge: Family;Available PRN/intermittently Type of Home: House Home Access: Stairs to enter   Entrance Stairs-Number of Steps: 1 Home Layout: One level Home Equipment: Cane - single point;Walker - 2 wheels      Prior Function Level of Independence: Independent               Hand Dominance        Extremity/Trunk Assessment   Upper Extremity Assessment Upper Extremity Assessment: Overall WFL for tasks assessed    Lower Extremity Assessment Lower Extremity Assessment: LLE deficits/detail LLE Deficits / Details: suspected post-surgical hip weakness, able to perform  10 quad sets  LLE: Unable to fully assess due to pain LLE Sensation: WNL    Cervical / Trunk Assessment Cervical / Trunk Assessment: Normal  Communication   Communication: No difficulties  Cognition Arousal/Alertness: Awake/alert Behavior During Therapy: WFL for tasks assessed/performed Overall Cognitive Status: Within Functional Limits for tasks assessed                                        General Comments      Exercises Total  Joint Exercises Quad Sets: AROM;Left;10 reps;Supine Heel Slides: AAROM;Left;10 reps;Supine   Assessment/Plan    PT Assessment Patient needs continued PT services  PT Problem List Decreased strength;Pain;Decreased range of motion;Decreased activity tolerance;Decreased balance;Decreased knowledge of use of DME;Decreased mobility       PT Treatment Interventions DME instruction;Therapeutic activities;Gait training;Therapeutic exercise;Patient/family education;Stair training;Balance training;Functional mobility training    PT Goals (Current goals can be found in the Care Plan section)  Acute Rehab PT Goals PT Goal Formulation: With patient Time For Goal Achievement: 02/01/18 Potential to Achieve Goals: Good    Frequency 7X/week   Barriers to discharge        Co-evaluation               AM-PAC PT "6 Clicks" Daily Activity  Outcome Measure Difficulty turning over in bed (including adjusting bedclothes, sheets and blankets)?: Unable Difficulty moving from lying on back to sitting on the side of the bed? : Unable Difficulty sitting down on and standing up from a chair with arms (e.g., wheelchair, bedside commode, etc,.)?: Unable Help needed moving to and from a bed to chair (including a wheelchair)?: A Little Help needed walking in hospital room?: A Little Help needed climbing 3-5 steps with a railing? : A Lot 6 Click Score: 11    End of Session Equipment Utilized During Treatment: Gait belt;Oxygen Activity Tolerance: Treatment limited secondary to medical complications (Comment);Patient limited by fatigue(Pt with period of suspected passing out after standing. Pt returned to supine position.) Patient left: in bed;with family/visitor present;with call bell/phone within reach;with SCD's reapplied Nurse Communication: Mobility status(period of passing out/not responding to PT) PT Visit Diagnosis: Other abnormalities of gait and mobility (R26.89);Difficulty in walking, not  elsewhere classified (R26.2)    Time: [id number] PT Time Calculation (min) (ACUTE ONLY): 28 min   Charges:   PT Evaluation $PT Eval Low Complexity: 1 Low PT Treatments $Therapeutic Activity: 8-22 mins        Shanequia Kendrick Terrial Rhodes Chyrl Elwell, PT, DPT  Pager # [phone]    Breylan Lefevers D Koraima Albertsen 01/18/2018, 4:58 PM

## 2018-01-18 NOTE — Brief Op Note (Signed)
01/18/2018  11:07 AM  PATIENT:  Helen Horton  77 y.o. female  PRE-OPERATIVE DIAGNOSIS:  LEFT HIP DEGENERATIVE JOINT DISEASE  POST-OPERATIVE DIAGNOSIS:  LEFT HIP DEGENERATIVE JOINT DISEASE  PROCEDURE:  Procedure(s): LEFT TOTAL HIP ARTHROPLASTY ANTERIOR APPROACH (Left)  SURGEON:  Surgeon(s) and Role:    Jodi Geralds* Kyston Gonce, MD - Primary  PHYSICIAN ASSISTANT:   ASSISTANTS: jim bethune pac   ANESTHESIA:   spinal  EBL:  350 mL   BLOOD ADMINISTERED:none  DRAINS: none   LOCAL MEDICATIONS USED:  MARCAINE    and OTHER experel  SPECIMEN:  No Specimen  DISPOSITION OF SPECIMEN:  N/A  COUNTS:  YES  TOURNIQUET:  * No tourniquets in log *  DICTATION: .Other Dictation: Dictation Number [id number]  PLAN OF CARE: Admit to inpatient   PATIENT DISPOSITION:  PACU - hemodynamically stable.   Delay start of Pharmacological VTE agent (>24hrs) due to surgical blood loss or risk of bleeding: no

## 2018-01-18 NOTE — Op Note (Signed)
Helen Horton, Helen Horton MEDICAL RECORD ZO:[id number] ACCOUNT [id number] DATE OF BIRTH:04/20/1941 FACILITY: [facility] LOCATION: [facility] PHYSICIAN:Jordin Dambrosio L. Amauri Medellin, MD  OPERATIVE REPORT  DATE OF PROCEDURE:  01/18/2018  PREOPERATIVE DIAGNOSIS:  End-stage degenerative joint disease, left hip, with severe arthritic change.  POSTOPERATIVE DIAGNOSIS:  End-stage degenerative joint disease, left hip, with severe arthritic change.  PROCEDURE:   1.  Left total hip replacement with a Corail size 11, regular stem, 48 mm porous coated cup, a neutral liner and a 28 mm +9 metal hip ball. 2.  Interpretation of multiple intraoperative fluoroscopic images.  SURGEON:  Jodi Geralds, MD  ASSISTANT:  Gus Puma, PA-C  ANESTHESIA:  Spinal.  BRIEF HISTORY:  The patient is a 77 year old female with a history of significant complaints of bilateral hip pain.  She had severe arthritis in both of her hips.  She was treated with a right total hip replacement and has done great with that.  She came  back for left total hip replacement.  She was brought to the operating room for this procedure.  She was having night pain and light activity pain and failed all conservative care.  DESCRIPTION OF PROCEDURE:  The patient was taken to the operating room after adequate anesthesia was obtained with a spinal anesthetic, the patient was placed supine on the operating table and moved onto the Hana bed.  All bony prominences well padded.   Attention was then turned to the left hip where after routine prep and drape an incision was made for an anterior approach to the hip subcutaneous tissue to the level of the tensor fascia was divided in line with its fibers.  The muscle was dissected and  retractors were put in place above and below the neck.  The hip capsule was opened and tagged and the provisional neck cut was then made and the head and neck removed and the leg was then externally rotated and traction placed on.  Retraction was put in  place above and level of the hip and the acetabulum was sequentially reamed to a level of 47 mm and a 48 mm Pinnacle porous coated cup was hammered into place, 45 degrees of lateral opening and degrees of anteversion.  Following this, attention turned  towards the placement of a retractor around the stem.  It was extended, externally rotated and adducted.  The capsule was then taken down posteriorly and the femur was then opened and sequentially rasped down to a level of a size 11.  We initially used a  high offset stem which was very shortly.  We had made a slightly longer than normal neck cut.  At this time, I felt like I needed to do a standard offset stem and lengthening the neck.  We put her to a standard stem size 11 with a 9 and this got [place]  within a couple of millimeters of leg length and I thought that was going to be a better choice and high offset.  At this point, the trial stem was removed.  The final was a final size 11 Corail stem was placed.  A +9 ball was opened and placed and the  hip reduced.  Anatomic reduction was achieved.  We had done a stability test prior to and stability was excellent.  At this point, the wound was irrigated thoroughly, suctioned dry.  We closed the hip capsule with #1 Vicryl running, closed the tensor  fascia with 0 Vicryl running, closed the skin with 0 and 2-0 Vicryl and 3-0  Monocryl subcuticular.  Benzoin and Steri-Strips were applied.  Sterile compressive dressing was applied.  The patient was taken to recovery and was noted to be in satisfactory  condition.  Estimated blood loss for procedure was about 350 mL but the final can be gotten from the anesthetic record.  Multiple intraoperative fluoroscopic images were taken to assess leg length fit and fill of the prosthesis.  TN/NUANCE  D:01/18/2018 T:01/18/2018 JOB:[id number]

## 2018-01-18 NOTE — Anesthesia Procedure Notes (Signed)
Spinal  Patient location during procedure: OR Start time: 01/18/2018 9:30 AM End time: 01/18/2018 9:32 AM Staffing Anesthesiologist: Effie Berkshire, MD Performed: anesthesiologist  Preanesthetic Checklist Completed: patient identified, site marked, surgical consent, pre-op evaluation, timeout performed, IV checked, risks and benefits discussed and monitors and equipment checked Spinal Block Patient position: sitting Prep: Betadine Patient monitoring: heart rate, continuous pulse ox, blood pressure and cardiac monitor Approach: midline Location: L4-5 Injection technique: single-shot Needle Needle type: Introducer and Pencan  Needle gauge: 24 G Needle length: 9 cm Additional Notes Negative paresthesia. Negative blood return. Positive free-flowing CSF. Expiration date of kit checked and confirmed. Patient tolerated procedure well, without complications.

## 2018-01-18 NOTE — Transfer of Care (Signed)
Immediate Anesthesia Transfer of Care Note  Patient: Helen NationsJane Nieland  Procedure(s) Performed: Procedure(s): LEFT TOTAL HIP ARTHROPLASTY ANTERIOR APPROACH (Left)  Patient Location: PACU  Anesthesia Type:Spinal  Level of Consciousness:  sedated, patient cooperative and responds to stimulation  Airway & Oxygen Therapy:Patient Spontanous Breathing and Patient connected to face mask oxgen  Post-op Assessment:  Report given to PACU RN and Post -op Vital signs reviewed and stable  Post vital signs:  Reviewed and stable  Last Vitals:  Vitals:   01/18/18 0754  BP: (!) 167/68  Pulse: 79  Resp: 18  Temp: 36.6 C  SpO2: 100%    Complications: No apparent anesthesia complications

## 2018-01-18 NOTE — Progress Notes (Signed)
Spoke with Dr Janee Mornhompson re: pt blood sugar of 225. Per Dr Janee Mornhompson, no insulin coverage at this time. Recheck blood sugar in AM.

## 2018-01-19 LAB — CBC
HCT: 24.2 % — ABNORMAL LOW (ref 36.0–46.0)
HEMATOCRIT: 23 % — AB (ref 36.0–46.0)
HEMOGLOBIN: 7.9 g/dL — AB (ref 12.0–15.0)
Hemoglobin: 7.6 g/dL — ABNORMAL LOW (ref 12.0–15.0)
MCH: 27 pg (ref 26.0–34.0)
MCH: 26.9 pg (ref 26.0–34.0)
MCHC: 33 g/dL (ref 30.0–36.0)
MCHC: 32.6 g/dL (ref 30.0–36.0)
MCV: 81.9 fL (ref 78.0–100.0)
MCV: 82.3 fL (ref 78.0–100.0)
PLATELETS: 268 10*3/uL (ref 150–400)
Platelets: 262 10*3/uL (ref 150–400)
RBC: 2.81 MIL/uL — ABNORMAL LOW (ref 3.87–5.11)
RBC: 2.94 MIL/uL — AB (ref 3.87–5.11)
RDW: 13.6 % (ref 11.5–15.5)
RDW: 13.9 % (ref 11.5–15.5)
WBC: 16.5 10*3/uL — ABNORMAL HIGH (ref 4.0–10.5)
WBC: 17.7 10*3/uL — ABNORMAL HIGH (ref 4.0–10.5)

## 2018-01-19 LAB — GLUCOSE, CAPILLARY
Glucose-Capillary: 164 mg/dL — ABNORMAL HIGH (ref 70–99)
Glucose-Capillary: 154 mg/dL — ABNORMAL HIGH (ref 70–99)

## 2018-01-19 NOTE — Progress Notes (Signed)
Physical Therapy Treatment Patient Details Name: Helen Horton MRN: [id number] DOB: [birth year] Today's Date: 01/19/2018    History of Present Illness 77 YO female s/p L DA-THA on 01/18/18. PMH includes arthritis, DM, HTN, lumbar laminectomy L4-L5, bilat cataracts, R DA-THA.     PT Comments    Pt continues to participate well. MD/PA came by and stated they will recheck her Hgb again before d/c. Reviewed/practiced gait training and stair training. All education completed. Okay to d/c from PT standpoint.     Follow Up Recommendations  Follow surgeon's recommendation for DC plan and follow-up therapies     Equipment Recommendations  None recommended by PT    Recommendations for Other Services       Precautions / Restrictions Precautions Precautions: Fall Restrictions Weight Bearing Restrictions: No LLE Weight Bearing: Weight bearing as tolerated    Mobility  Bed Mobility Overal bed mobility: Needs Assistance Bed Mobility: Supine to Sit          General bed mobility comments: oob in recliner  Transfers Overall transfer level: Needs assistance Equipment used: Rolling walker (2 wheeled) Transfers: Sit to/from Stand Sit to Stand: Supervision         General transfer comment: for safety. VCs hand placement  Ambulation/Gait Ambulation/Gait assistance: Min guard Gait Distance (Feet): 115 Feet Assistive device: Rolling walker (2 wheeled) Gait Pattern/deviations: Step-to pattern;Step-through pattern;Decreased stride length     General Gait Details: close guard for safety. Pt c/o increased pain/tightness this afternoon   Stairs Stairs: Yes Stairs assistance: Min guard Stair Management: Forwards;With walker;Step to pattern Number of Stairs: 1 General stair comments: Close guard for safety. VCs safety, sequence, technique.    Wheelchair Mobility    Modified Rankin (Stroke Patients Only)       Balance Overall balance assessment: Mild deficits observed, not  formally tested                                          Cognition Arousal/Alertness: Awake/alert Behavior During Therapy: WFL for tasks assessed/performed Overall Cognitive Status: Within Functional Limits for tasks assessed                                        Exercises     General Comments        Pertinent Vitals/Pain Pain Assessment: 0-10 Pain Score: 7  Pain Location: L hip Pain Descriptors / Indicators: Discomfort;Sore;Aching;Grimacing Pain Intervention(s): Monitored during session;Repositioned;Ice applied;Patient requesting pain meds-RN notified    Home Living                      Prior Function            PT Goals (current goals can now be found in the care plan section) Progress towards PT goals: Progressing toward goals    Frequency    7X/week      PT Plan Current plan remains appropriate    Co-evaluation              AM-PAC PT "6 Clicks" Daily Activity  Outcome Measure  Difficulty turning over in bed (including adjusting bedclothes, sheets and blankets)?: A Little Difficulty moving from lying on back to sitting on the side of the bed? : A Little Difficulty sitting down on and standing up from a chair  with arms (e.g., wheelchair, bedside commode, etc,.)?: A Little Help needed moving to and from a bed to chair (including a wheelchair)?: A Little Help needed walking in hospital room?: A Little Help needed climbing 3-5 steps with a railing? : A Little 6 Click Score: 18    End of Session Equipment Utilized During Treatment: Gait belt Activity Tolerance: Patient tolerated treatment well Patient left: in chair;with call bell/phone within reach   PT Visit Diagnosis: Other abnormalities of gait and mobility (R26.89);Difficulty in walking, not elsewhere classified (R26.2)     Time: 1350-1405 PT Time Calculation (min) (ACUTE ONLY): 15 min  Charges:  $Gait Training: 8-22 mins                         [name], MPT Pager: [phone]

## 2018-01-19 NOTE — Plan of Care (Signed)
Patient s/p left total hip direct anterior.  Plan is for discharge later today.  Pain controlled adequately by po pain meds.  Patient tolerating po with no complaints of n/v.  Ambulated down hallway with PT and did well.  Call bell within reach.  Fall precautions in place. Problem: Education: Goal: Knowledge of General Education information will improve Description Including pain rating scale, medication(s)/side effects and non-pharmacologic comfort measures Outcome: Progressing   Problem: Health Behavior/Discharge Planning: Goal: Ability to manage health-related needs will improve Outcome: Progressing   Problem: Clinical Measurements: Goal: Ability to maintain clinical measurements within normal limits will improve Outcome: Progressing Goal: Will remain free from infection Outcome: Progressing Goal: Diagnostic test results will improve Outcome: Progressing Goal: Respiratory complications will improve Outcome: Progressing Goal: Cardiovascular complication will be avoided Outcome: Progressing   Problem: Activity: Goal: Risk for activity intolerance will decrease Outcome: Progressing   Problem: Nutrition: Goal: Adequate nutrition will be maintained Outcome: Progressing   Problem: Coping: Goal: Level of anxiety will decrease Outcome: Progressing   Problem: Elimination: Goal: Will not experience complications related to bowel motility Outcome: Progressing Goal: Will not experience complications related to urinary retention Outcome: Progressing   Problem: Pain Managment: Goal: General experience of comfort will improve Outcome: Progressing   Problem: Safety: Goal: Ability to remain free from injury will improve Outcome: Progressing   Problem: Skin Integrity: Goal: Risk for impaired skin integrity will decrease Outcome: Progressing   Problem: Education: Goal: Knowledge of the prescribed therapeutic regimen will improve Outcome: Progressing Goal: Understanding of  discharge needs will improve Outcome: Progressing Goal: Individualized Educational Video(s) Outcome: Progressing   Problem: Activity: Goal: Ability to avoid complications of mobility impairment will improve Outcome: Progressing Goal: Ability to tolerate increased activity will improve Outcome: Progressing   Problem: Clinical Measurements: Goal: Postoperative complications will be avoided or minimized Outcome: Progressing   Problem: Pain Management: Goal: Pain level will decrease with appropriate interventions Outcome: Progressing   Problem: Skin Integrity: Goal: Will show signs of wound healing Outcome: Progressing

## 2018-01-19 NOTE — Progress Notes (Signed)
Physical Therapy Treatment Patient Details Name: Helen NationsJane Phariss MRN: [id number] DOB: 1940/10/20 Today's Date: 01/19/2018    History of Present Illness 77 YO female s/p L DA-THA on 01/18/18. PMH includes arthritis, DM, HTN, lumbar laminectomy L4-L5, bilat cataracts, R DA-THA.     PT Comments    Progressing with mobility. Pt tolerated activity well. Possible d/c later today so will return to practice stair negotiation.    Follow Up Recommendations  Follow surgeon's recommendation for DC plan and follow-up therapies     Equipment Recommendations  None recommended by PT    Recommendations for Other Services       Precautions / Restrictions Precautions Precautions: Fall Restrictions Weight Bearing Restrictions: No LLE Weight Bearing: Weight bearing as tolerated    Mobility  Bed Mobility Overal bed mobility: Needs Assistance Bed Mobility: Supine to Sit     Supine to sit: Supervision     General bed mobility comments: for safety.   Transfers Overall transfer level: Needs assistance Equipment used: Rolling walker (2 wheeled) Transfers: Sit to/from Stand Sit to Stand: Supervision         General transfer comment: for safety. VCs hand placement  Ambulation/Gait Ambulation/Gait assistance: Min guard Gait Distance (Feet): 300 Feet Assistive device: Rolling walker (2 wheeled) Gait Pattern/deviations: Step-to pattern;Step-through pattern;Decreased stride length     General Gait Details: close guard for safety. Pt requested to walk a long distance.    Stairs             Wheelchair Mobility    Modified Rankin (Stroke Patients Only)       Balance Overall balance assessment: Mild deficits observed, not formally tested                                          Cognition Arousal/Alertness: Awake/alert Behavior During Therapy: WFL for tasks assessed/performed Overall Cognitive Status: Within Functional Limits for tasks assessed                                         Exercises Total Joint Exercises Ankle Circles/Pumps: AROM;Both;10 reps;Supine Quad Sets: AROM;Both;10 reps;Supine Heel Slides: Left;10 reps;Supine;AROM Hip ABduction/ADduction: AROM;Left;10 reps;Supine    General Comments        Pertinent Vitals/Pain Pain Assessment: 0-10 Pain Score: 6  Pain Location: L hip Pain Descriptors / Indicators: Discomfort;Sore;Aching Pain Intervention(s): Monitored during session;Repositioned;Ice applied    Home Living                      Prior Function            PT Goals (current goals can now be found in the care plan section) Progress towards PT goals: Progressing toward goals    Frequency    7X/week      PT Plan Current plan remains appropriate    Co-evaluation              AM-PAC PT "6 Clicks" Daily Activity  Outcome Measure  Difficulty turning over in bed (including adjusting bedclothes, sheets and blankets)?: A Little Difficulty moving from lying on back to sitting on the side of the bed? : A Little Difficulty sitting down on and standing up from a chair with arms (e.g., wheelchair, bedside commode, etc,.)?: A Little Help needed moving to and from  a bed to chair (including a wheelchair)?: A Little Help needed walking in hospital room?: A Little Help needed climbing 3-5 steps with a railing? : A Little 6 Click Score: 18    End of Session Equipment Utilized During Treatment: Gait belt Activity Tolerance: Patient tolerated treatment well Patient left: in chair;with call bell/phone within reach   PT Visit Diagnosis: Other abnormalities of gait and mobility (R26.89);Difficulty in walking, not elsewhere classified (R26.2)     Time: 1011-1030 PT Time Calculation (min) (ACUTE ONLY): 19 min  Charges:  $Gait Training: 8-22 mins                       Rebeca Alert, MPT Pager: [phone]

## 2018-01-19 NOTE — Care Management Note (Signed)
Case Management Note  Patient Details  Name: Helen Horton MRN: [id number] Date of Birth: [birth year]  Subjective/Objective:  77 yo F s/p L DA-THA                 Action/Plan: HH, DME   Expected Discharge Date:  01/19/18               Expected Discharge Plan:  Home/Self Care(MD is not sure if he is going to order [facility])  In-House Referral:     Discharge planning Services  CM Consult  Post Acute Care Choice:  Home Health Choice offered to:  Patient  DME Arranged:    DME Agency:     HH Arranged:    [place] Agency:  [place]  Status of Service:  In process, will continue to follow  If discussed at Long Length of Stay Meetings, dates discussed:    Additional Comments: Met with pt at beside. She plans to return home with the support of her daughters. She has a RW and a 3-in-1 BSC. Discussed HHPT. She stated that back in March she had HH and she used [facility]. MD is in the room and he is not sure if he is going to order [facility]. Will continue to f/u to assist with [facility] if needed.   Norina Buzzard, RN 01/19/2018, 1:35 PM

## 2018-01-19 NOTE — Discharge Summary (Signed)
Physician Discharge Summary  Patient ID: Helen Horton Prom MRN: [id number] DOB/AGE: 12-10-1940 77 y.o.  Admit date: 01/18/2018 Discharge date: 01/19/2018  Admission Diagnoses:  Primary osteoarthritis of left hip  Discharge Diagnoses:  Principal Problem:   Primary osteoarthritis of left hip Active Problems:   H/O total hip arthroplasty   Past Medical History:  Diagnosis Date  . Arthritis   . Complication of anesthesia 2017   took really a long time waking up from back surgery  . Diabetes mellitus without complication (HCC)   . Hypertension     Surgeries: Procedure(s): LEFT TOTAL HIP ARTHROPLASTY ANTERIOR APPROACH on 01/18/2018   Consultants (if any):   Discharged Condition: Improved  Hospital Course: Helen Horton Fear is an 77 y.o. female who was admitted 01/18/2018 with a diagnosis of Primary osteoarthritis of left hip and went to the operating room on 01/18/2018 and underwent the above named procedures.    She was given perioperative antibiotics:  Anti-infectives (From admission, onward)   Start     Dose/Rate Route Frequency Ordered Stop   01/18/18 1530  ceFAZolin (ANCEF) IVPB 2g/100 mL premix     2 g 200 mL/hr over 30 Minutes Intravenous Every 6 hours 01/18/18 1238 01/18/18 2201   01/18/18 0745  ceFAZolin (ANCEF) IVPB 2g/100 mL premix     2 g 200 mL/hr over 30 Minutes Intravenous On call to O.R. 01/18/18 [id number] 01/18/18 0931    .  She was given sequential compression devices, early ambulation, and ASA for DVT prophylaxis.  She benefited maximally from the hospital stay and there were no complications.  At the time of d/c, the dressing was clean and dry, pain controlled for d/c home, and limb NVI.  F/u CBC was 7.9, and patient was not symptomatic with this.  Recent vital signs:  Vitals:   01/19/18 0959 01/19/18 1034  BP: (!) 97/38 (!) 131/56  Pulse: 79   Resp: 18   Temp: 98.4 F (36.9 C)   SpO2: 97%     Recent laboratory studies:  Lab Results  Component Value Date   HGB  7.6 (L) 01/19/2018   HGB 12.0 01/09/2018   HGB 9.5 (L) 08/25/2017   Lab Results  Component Value Date   WBC 16.5 (H) 01/19/2018   PLT 262 01/19/2018   Lab Results  Component Value Date   INR 0.94 01/09/2018   Lab Results  Component Value Date   NA 135 01/09/2018   K 3.8 01/09/2018   CL 93 (L) 01/09/2018   CO2 30 01/09/2018   BUN 16 01/09/2018   CREATININE 0.74 01/09/2018   GLUCOSE 94 01/09/2018    Discharge Medications:   Allergies as of 01/19/2018   No Known Allergies     Medication List    TAKE these medications   acetaminophen 500 MG tablet Commonly known as:  TYLENOL Take 1,000 mg by mouth every 6 (six) hours as needed for moderate pain or headache.   aspirin EC 325 MG tablet Take 1 tablet (325 mg total) by mouth 2 (two) times daily after a meal. Take x 1 month post op to decrease risk of blood clots. What changed:    medication strength  how much to take  when to take this  additional instructions   docusate sodium 100 MG capsule Commonly known as:  COLACE Take 1 capsule (100 mg total) by mouth 2 (two) times daily.   HYDROcodone-acetaminophen 5-325 MG tablet Commonly known as:  NORCO/VICODIN Take 1-2 tablets by mouth every 6 (six) hours as  needed for moderate pain.   levothyroxine 88 MCG tablet Commonly known as:  SYNTHROID, LEVOTHROID Take 88 mcg by mouth daily before breakfast.   lisinopril-hydrochlorothiazide 10-12.5 MG tablet Commonly known as:  PRINZIDE,ZESTORETIC Take 1 tablet by mouth daily before breakfast.   metFORMIN 1000 MG tablet Commonly known as:  GLUCOPHAGE Take 1,000 mg by mouth daily after breakfast.   omeprazole 40 MG capsule Commonly known as:  PRILOSEC Take 40 mg by mouth daily before breakfast.   temazepam 15 MG capsule Commonly known as:  RESTORIL Take 15 mg by mouth at bedtime.   THERATEARS 0.25 % Soln Generic drug:  Carboxymethylcellulose Sodium Place 2 drops into both eyes daily as needed (for dry eyes).    tiZANidine 2 MG tablet Commonly known as:  ZANAFLEX Take 1 tablet (2 mg total) by mouth every 8 (eight) hours as needed for muscle spasms.   Vitamin D3 2000 units Tabs Take 2,000 Units by mouth daily before breakfast.       Diagnostic Studies: Dg Chest 2 View  Result Date: 01/09/2018 CLINICAL DATA:  Preop total hip replacement. EXAM: CHEST - 2 VIEW COMPARISON:  08/17/2017 FINDINGS: The lungs are clear without focal pneumonia, edema, pneumothorax or pleural effusion. The cardiopericardial silhouette is within normal limits for size. The visualized bony structures of the thorax are intact. IMPRESSION: No active cardiopulmonary disease. Electronically Signed   By: Kennith Center M.D.   On: 01/09/2018 16:49   Dg C-arm 1-60 Min-no Report  Result Date: 01/18/2018 Fluoroscopy was utilized by the requesting physician.  No radiographic interpretation.   Dg Hip Operative Unilat W Or W/o Pelvis Left  Result Date: 01/18/2018 CLINICAL DATA:  Left hip replacement EXAM: OPERATIVE LEFT HIP (WITH PELVIS IF PERFORMED) 2 VIEWS TECHNIQUE: Fluoroscopic spot image(s) were submitted for interpretation post-operatively. COMPARISON:  10/28/2016. FINDINGS: Prior right hip replacement. New changes of left hip replacement. No AP alignment. No hardware bony complicating feature. IMPRESSION: Left hip replacement.  No visible complicating feature. Electronically Signed   By: Charlett Nose M.D.   On: 01/18/2018 11:11    Disposition: Discharge disposition: 01-Home or Self Care         Follow-up Information    Jodi Geralds, MD. Schedule an appointment as soon as possible for a visit in 2 weeks.   Specialty:  Orthopedic Surgery Contact information: [address] [place] [address] [phone]            Signed: Jodi Marble 01/19/2018, 1:20 PM

## 2018-01-19 NOTE — Progress Notes (Signed)
Notified Dr. Janee Mornhompson of CBC results.  Ok for discharge.

## 2018-01-19 NOTE — Progress Notes (Signed)
Tol PO, voiding, pain controlled Had 10 sec syncopal episode last night, but none today and was up with PT  Dressing C/D/I, NVI, but Hgb 7.6  Will recheck Hgb prior to d/c

## 2018-01-21 ENCOUNTER — Encounter (HOSPITAL_COMMUNITY): Payer: Self-pay | Admitting: Orthopedic Surgery

## 2018-03-23 ENCOUNTER — Other Ambulatory Visit: Payer: Self-pay

## 2018-03-23 ENCOUNTER — Emergency Department (HOSPITAL_BASED_OUTPATIENT_CLINIC_OR_DEPARTMENT_OTHER): Payer: Medicare Other

## 2018-03-23 ENCOUNTER — Encounter (HOSPITAL_BASED_OUTPATIENT_CLINIC_OR_DEPARTMENT_OTHER): Payer: Self-pay | Admitting: Emergency Medicine

## 2018-03-23 ENCOUNTER — Emergency Department (HOSPITAL_BASED_OUTPATIENT_CLINIC_OR_DEPARTMENT_OTHER)
Admission: EM | Admit: 2018-03-23 | Discharge: 2018-03-23 | Disposition: A | Discharge status: Home / Self Care | Payer: Medicare Other | Attending: Emergency Medicine | Admitting: Emergency Medicine

## 2018-03-23 DIAGNOSIS — Y999 Unspecified external cause status: Secondary | ICD-10-CM | POA: Diagnosis not present

## 2018-03-23 DIAGNOSIS — S300XXA Contusion of lower back and pelvis, initial encounter: Secondary | ICD-10-CM

## 2018-03-23 DIAGNOSIS — E119 Type 2 diabetes mellitus without complications: Secondary | ICD-10-CM | POA: Insufficient documentation

## 2018-03-23 DIAGNOSIS — R55 Syncope and collapse: Secondary | ICD-10-CM | POA: Insufficient documentation

## 2018-03-23 DIAGNOSIS — S0990XA Unspecified injury of head, initial encounter: Secondary | ICD-10-CM | POA: Diagnosis present

## 2018-03-23 DIAGNOSIS — Z96643 Presence of artificial hip joint, bilateral: Secondary | ICD-10-CM | POA: Diagnosis not present

## 2018-03-23 DIAGNOSIS — Z7984 Long term (current) use of oral hypoglycemic drugs: Secondary | ICD-10-CM | POA: Insufficient documentation

## 2018-03-23 DIAGNOSIS — I10 Essential (primary) hypertension: Secondary | ICD-10-CM | POA: Insufficient documentation

## 2018-03-23 DIAGNOSIS — Z23 Encounter for immunization: Secondary | ICD-10-CM | POA: Diagnosis not present

## 2018-03-23 DIAGNOSIS — Y92002 Bathroom of unspecified non-institutional (private) residence single-family (private) house as the place of occurrence of the external cause: Secondary | ICD-10-CM | POA: Insufficient documentation

## 2018-03-23 DIAGNOSIS — Y9389 Activity, other specified: Secondary | ICD-10-CM | POA: Diagnosis not present

## 2018-03-23 DIAGNOSIS — Z79899 Other long term (current) drug therapy: Secondary | ICD-10-CM | POA: Insufficient documentation

## 2018-03-23 DIAGNOSIS — S0101XA Laceration without foreign body of scalp, initial encounter: Secondary | ICD-10-CM | POA: Insufficient documentation

## 2018-03-23 DIAGNOSIS — W01198A Fall on same level from slipping, tripping and stumbling with subsequent striking against other object, initial encounter: Secondary | ICD-10-CM | POA: Insufficient documentation

## 2018-03-23 DIAGNOSIS — R42 Dizziness and giddiness: Secondary | ICD-10-CM

## 2018-03-23 LAB — URINALYSIS, MICROSCOPIC (REFLEX)

## 2018-03-23 LAB — URINALYSIS, ROUTINE W REFLEX MICROSCOPIC
BILIRUBIN URINE: NEGATIVE
Glucose, UA: NEGATIVE mg/dL
HGB URINE DIPSTICK: NEGATIVE
Ketones, ur: NEGATIVE mg/dL
Nitrite: NEGATIVE
PH: 6.5 (ref 5.0–8.0)
Protein, ur: NEGATIVE mg/dL

## 2018-03-23 LAB — BASIC METABOLIC PANEL
ANION GAP: 14 (ref 5–15)
BUN: 13 mg/dL (ref 8–23)
CHLORIDE: 91 mmol/L — AB (ref 98–111)
CO2: 27 mmol/L (ref 22–32)
Calcium: 8.9 mg/dL (ref 8.9–10.3)
Creatinine, Ser: 0.71 mg/dL (ref 0.44–1.00)
GFR calc non Af Amer: >60 mL/min (ref >60)
Glucose, Bld: 149 mg/dL — ABNORMAL HIGH (ref 70–99)
Potassium: 3 mmol/L — ABNORMAL LOW (ref 3.5–5.1)
Sodium: 132 mmol/L — ABNORMAL LOW (ref 135–145)

## 2018-03-23 LAB — CBC
HEMATOCRIT: 33.1 % — AB (ref 36.0–46.0)
Hemoglobin: 10.3 g/dL — ABNORMAL LOW (ref 12.0–15.0)
MCH: 25.4 pg — AB (ref 26.0–34.0)
MCHC: 31.1 g/dL (ref 30.0–36.0)
MCV: 81.7 fL (ref 80.0–100.0)
NRBC: 0 % (ref 0.0–0.2)
Platelets: 411 10*3/uL — ABNORMAL HIGH (ref 150–400)
RBC: 4.05 MIL/uL (ref 3.87–5.11)
RDW: 13.7 % (ref 11.5–15.5)
WBC: 9.8 10*3/uL (ref 4.0–10.5)

## 2018-03-23 LAB — TROPONIN I: Troponin I: <0.03 ng/mL (ref <0.03)

## 2018-03-23 LAB — CBG MONITORING, ED: Glucose-Capillary: 136 mg/dL — ABNORMAL HIGH (ref 70–99)

## 2018-03-23 MED ORDER — LIDOCAINE HCL 2 % IJ SOLN
20.0000 mL | Freq: Once | INTRAMUSCULAR | Status: AC
  Administered 2018-03-23: 400 mg
  Filled 2018-03-23: qty 20

## 2018-03-23 MED ORDER — SODIUM CHLORIDE 0.9 % IV BOLUS
1000.0000 mL | Freq: Once | INTRAVENOUS | Status: AC
  Administered 2018-03-23: 1000 mL via INTRAVENOUS

## 2018-03-23 MED ORDER — TETANUS-DIPHTH-ACELL PERTUSSIS 5-2.5-18.5 LF-MCG/0.5 IM SUSP
0.5000 mL | Freq: Once | INTRAMUSCULAR | Status: AC
  Administered 2018-03-23: 0.5 mL via INTRAMUSCULAR
  Filled 2018-03-23: qty 0.5

## 2018-03-23 NOTE — ED Provider Notes (Signed)
LACERATION REPAIR Performed by: Kelle Darting Latrell Reitan Authorized by: Kelle Darting Palma Buster Consent: Verbal consent obtained. Risks and benefits: risks, benefits and alternatives were discussed Consent given by: patient Patient identity confirmed: provided demographic data Prepped and Draped in normal sterile fashion Wound explored  Laceration Location: scalp  Laceration Length: 6cm  No Foreign Bodies seen or palpated  Anesthesia: local infiltration  Local anesthetic: lidocaine 2% 0 epinephrine  Anesthetic total: 4 ml  Irrigation method: syringe Amount of cleaning: standard  Skin closure: simple  Number of sutures: 11  Technique: simple interrupted   Patient tolerance: Patient tolerated the procedure well with no immediate complications.   Eyvonne Mechanic, PA-C 03/23/18 1122    Jacalyn Lefevre, MD 03/23/18 [phone]

## 2018-03-23 NOTE — ED Notes (Signed)
ED Provider at bedside. 

## 2018-03-23 NOTE — ED Provider Notes (Signed)
[facility] EMERGENCY DEPARTMENT Provider Note   CSN: [id number] Arrival date & time: 03/23/18  0941     History   Chief Complaint Chief Complaint  Patient presents with  . Loss of Consciousness    HPI Helen Horton is a 77 y.o. female.  Pt presents to the ED today with a syncopal event.  The pt said she woke up and felt fine.  She got her paper and started eating breakfast.  She started to feel dizzy (she has a hx of intermittent vertigo) and put her head down.  She got up to use the bathroom and passed out in the bathroom.  She hit her head on the tile floor.  She did vomit once after hitting her head.  She said she feels fine now, other than some tailbone pain and a mild h/a.  She did sustain a lac to her head.  She called her daughter who brought her here.  She is not on blood thinners.     Past Medical History:  Diagnosis Date  . Arthritis   . Complication of anesthesia 2017   took really a long time waking up from back surgery  . Diabetes mellitus without complication (HCC)   . Hypertension     Patient Active Problem List   Diagnosis Date Noted  . Primary osteoarthritis of left hip 01/18/2018  . H/O total hip arthroplasty 01/18/2018  . Primary osteoarthritis of right hip 08/24/2017    Past Surgical History:  Procedure Laterality Date  . BACK SURGERY  2017   lumbar L4-5 laminectomy  . CHOLECYSTECTOMY    . EYE SURGERY     bilateral cataract with lens implant  . TONSILLECTOMY    . TOTAL HIP ARTHROPLASTY Right 08/24/2017   Procedure: RIGHT TOTAL HIP ARTHROPLASTY ANTERIOR APPROACH;  Surgeon: Jodi Geralds, MD;  Location: [facility];  Service: Orthopedics;  Laterality: Right;  . TOTAL HIP ARTHROPLASTY Left 01/18/2018   Procedure: LEFT TOTAL HIP ARTHROPLASTY ANTERIOR APPROACH;  Surgeon: Jodi Geralds, MD;  Location: [facility];  Service: Orthopedics;  Laterality: Left;     OB History   None      Home Medications    Prior to Admission medications   Medication  Sig Start Date End Date Taking? Authorizing Provider  acetaminophen (TYLENOL) 500 MG tablet Take 1,000 mg by mouth every 6 (six) hours as needed for moderate pain or headache.    [provider]  aspirin EC 325 MG tablet Take 1 tablet (325 mg total) by mouth 2 (two) times daily after a meal. Take x 1 month post op to decrease risk of blood clots. 01/18/18   Marshia Ly, PA-C  Carboxymethylcellulose Sodium (THERATEARS) 0.25 % SOLN Place 2 drops into both eyes daily as needed (for dry eyes).    [provider]  Cholecalciferol (VITAMIN D3) 2000 units TABS Take 2,000 Units by mouth daily before breakfast.    [provider]  docusate sodium (COLACE) 100 MG capsule Take 1 capsule (100 mg total) by mouth 2 (two) times daily. Patient not taking: Reported on 01/03/2018 08/24/17   Marshia Ly, PA-C  HYDROcodone-acetaminophen [facility]) 5-325 MG tablet Take 1-2 tablets by mouth every 6 (six) hours as needed for moderate pain. 01/18/18   Marshia Ly, PA-C  levothyroxine (SYNTHROID, LEVOTHROID) 88 MCG tablet Take 88 mcg by mouth daily before breakfast.    [provider]  lisinopril-hydrochlorothiazide (PRINZIDE,ZESTORETIC) 10-12.5 MG tablet Take 1 tablet by mouth daily before breakfast.     [provider]  metFORMIN (GLUCOPHAGE) 1000 MG tablet Take 1,000 mg by mouth daily after breakfast.    [provider]  omeprazole (PRILOSEC) 40 MG capsule Take 40 mg by mouth daily before breakfast.    [provider]  temazepam (RESTORIL) 15 MG capsule Take 15 mg by mouth at bedtime.    [provider]  tiZANidine (ZANAFLEX) 2 MG tablet Take 1 tablet (2 mg total) by mouth every 8 (eight) hours as needed for muscle spasms. 01/18/18   Marshia Ly, PA-C    Family History No family history on file.  Social History Social History   Tobacco Use  . Smoking status: Never Smoker  . Smokeless tobacco: Never Used  Substance Use Topics  . Alcohol  use: Yes    Comment: rarely wine  . Drug use: Not Currently     Allergies   Patient has no known allergies.   Review of Systems Review of Systems  Gastrointestinal: Positive for vomiting.  Skin: Positive for wound.  Neurological: Positive for dizziness and headaches.  All other systems reviewed and are negative.    Physical Exam Updated Vital Signs BP (!) 151/84 (BP Location: Right Arm)   Pulse (!) 59   Temp 98.5 F (36.9 C) (Oral)   Resp 17   SpO2 100%   Physical Exam  Constitutional: She is oriented to person, place, and time. She appears well-developed and well-nourished.  HENT:  Head: Normocephalic.  Right Ear: External ear normal.  Left Ear: External ear normal.  Nose: Nose normal.  Mouth/Throat: Oropharynx is clear and moist.  Large lac top of head  Eyes: Pupils are equal, round, and reactive to light. Conjunctivae and EOM are normal.  Neck: Normal range of motion. Neck supple.  Cardiovascular: Normal rate, regular rhythm, normal heart sounds and intact distal pulses.  Pulmonary/Chest: Effort normal and breath sounds normal.  Abdominal: Soft. Bowel sounds are normal.  Musculoskeletal: Normal range of motion.  Neurological: She is alert and oriented to person, place, and time.  Skin: Skin is warm. Capillary refill takes less than 2 seconds.  Psychiatric: She has a normal mood and affect. Her behavior is normal. Judgment and thought content normal.  Nursing note and vitals reviewed.    ED Treatments / Results  Labs (all labs ordered are listed, but only abnormal results are displayed) Labs Reviewed  BASIC METABOLIC PANEL - Abnormal; Notable for the following components:      Result Value   Sodium 132 (*)    Potassium 3.0 (*)    Chloride 91 (*)    Glucose, Bld 149 (*)    All other components within normal limits  CBC - Abnormal; Notable for the following components:   Hemoglobin 10.3 (*)    HCT 33.1 (*)    MCH 25.4 (*)    Platelets 411 (*)    All  other components within normal limits  URINALYSIS, ROUTINE W REFLEX MICROSCOPIC - Abnormal; Notable for the following components:   Specific Gravity, Urine <1.005 (*)    Leukocytes, UA TRACE (*)    All other components within normal limits  URINALYSIS, MICROSCOPIC (REFLEX) - Abnormal; Notable for the following components:   Bacteria, UA MANY (*)    All other components within normal limits  CBG MONITORING, ED - Abnormal; Notable for the following components:   Glucose-Capillary 136 (*)    All other components within normal limits  TROPONIN I    EKG EKG Interpretation  Date/Time:  Saturday March 23 2018  09:58:44 EDT Ventricular Rate:  68 PR Interval:    QRS Duration: 95 QT Interval:  423 QTC Calculation: 450 R Axis:   -19 Text Interpretation:  Sinus rhythm Probable left atrial enlargement Borderline left axis deviation Low voltage, precordial leads Consider anterior infarct Baseline wander in lead(s) II III aVR aVF V1 V5 No significant change since last tracing Confirmed by Jacalyn Lefevre [phone]) on 03/23/2018 10:36:09 AM   Radiology Dg Chest 2 View  Result Date: 03/23/2018 CLINICAL DATA:  Syncopal episode this morning. EXAM: CHEST - 2 VIEW COMPARISON:  None. FINDINGS: Normal sized heart. Mildly tortuous and calcified thoracic aorta. Clear lungs with normal vascularity. Thoracic spine degenerative changes. Cholecystectomy clips. IMPRESSION: No acute abnormality. Electronically Signed   By: Beckie Salts M.D.   On: 03/23/2018 10:56   Dg Pelvis 1-2 Views  Result Date: 03/23/2018 CLINICAL DATA:  Syncopal episode this morning with sacrococcygeal pain. EXAM: PELVIS - 1-2 VIEW COMPARISON:  Sacrum and coccyx radiographs obtained at the same time. FINDINGS: Bilateral hip prostheses. No fracture or dislocation seen. Lower lumbar spine degenerative changes. IMPRESSION: 1. No fracture or dislocation. 2. Hardware intact. Electronically Signed   By: Beckie Salts M.D.   On: 03/23/2018 10:58    Dg Sacrum/coccyx  Result Date: 03/23/2018 CLINICAL DATA:  Sacrococcygeal pain following a syncopal episode this morning. EXAM: SACRUM AND COCCYX - 2+ VIEW COMPARISON:  Pelvis radiograph obtained at the same time. FINDINGS: Normal appearing sacrum and coccyx without fracture or subluxation. Previously noted bilateral hip prostheses and lower lumbar spine degenerative changes. IMPRESSION: 1. No fracture or subluxation. 2. Hardware intact. Electronically Signed   By: Beckie Salts M.D.   On: 03/23/2018 10:58   Ct Head Wo Contrast  Result Date: 03/23/2018 CLINICAL DATA:  Syncope.  Laceration to posterior scalp. EXAM: CT HEAD WITHOUT CONTRAST CT CERVICAL SPINE WITHOUT CONTRAST TECHNIQUE: Multidetector CT imaging of the head and cervical spine was performed following the standard protocol without intravenous contrast. Multiplanar CT image reconstructions of the cervical spine were also generated. COMPARISON:  None. FINDINGS: CT HEAD FINDINGS Brain: No subdural, epidural, or subarachnoid hemorrhage. Cerebellum, brainstem, and basal cisterns are normal. Ventricles and sulci are unremarkable. Mild white matter changes. No acute cortical ischemia or infarct identified. No mass effect or midline shift. Vascular: No hyperdense vessel or unexpected calcification. Skull: Normal. Negative for fracture or focal lesion. Sinuses/Orbits: No acute finding. Other: None. CT CERVICAL SPINE FINDINGS Alignment: Minimal retrolisthesis of C4 versus C5 is thought to be degenerative in nature with no adjacent soft tissue swelling or fracture noted. Minimal anterolisthesis of C5 versus C6 is also most likely degenerative. No other malalignment. Skull base and vertebrae: No fractures noted. Soft tissues and spinal canal: No prevertebral fluid or swelling. No visible canal hematoma. Disc levels: Multilevel degenerative disc disease with small anterior and posterior osteophytes. Upper chest: Negative. Other: No other abnormalities.  IMPRESSION: 1. No acute intracranial abnormalities noted. 2. No fracture or traumatic malalignment in the cervical spine. Degenerative disc disease and facet degenerative changes noted. Minimal retrolisthesis is C4 versus C5 and anterolisthesis of C5 versus C6 are thought to be degenerative. Electronically Signed   By: Gerome Sam III M.D   On: 03/23/2018 11:17   Ct Cervical Spine Wo Contrast  Result Date: 03/23/2018 CLINICAL DATA:  Syncope.  Laceration to posterior scalp. EXAM: CT HEAD WITHOUT CONTRAST CT CERVICAL SPINE WITHOUT CONTRAST TECHNIQUE: Multidetector CT imaging of the head and cervical spine was performed following the standard protocol without intravenous contrast.  Multiplanar CT image reconstructions of the cervical spine were also generated. COMPARISON:  None. FINDINGS: CT HEAD FINDINGS Brain: No subdural, epidural, or subarachnoid hemorrhage. Cerebellum, brainstem, and basal cisterns are normal. Ventricles and sulci are unremarkable. Mild white matter changes. No acute cortical ischemia or infarct identified. No mass effect or midline shift. Vascular: No hyperdense vessel or unexpected calcification. Skull: Normal. Negative for fracture or focal lesion. Sinuses/Orbits: No acute finding. Other: None. CT CERVICAL SPINE FINDINGS Alignment: Minimal retrolisthesis of C4 versus C5 is thought to be degenerative in nature with no adjacent soft tissue swelling or fracture noted. Minimal anterolisthesis of C5 versus C6 is also most likely degenerative. No other malalignment. Skull base and vertebrae: No fractures noted. Soft tissues and spinal canal: No prevertebral fluid or swelling. No visible canal hematoma. Disc levels: Multilevel degenerative disc disease with small anterior and posterior osteophytes. Upper chest: Negative. Other: No other abnormalities. IMPRESSION: 1. No acute intracranial abnormalities noted. 2. No fracture or traumatic malalignment in the cervical spine. Degenerative disc  disease and facet degenerative changes noted. Minimal retrolisthesis is C4 versus C5 and anterolisthesis of C5 versus C6 are thought to be degenerative. Electronically Signed   By: Gerome Sam III M.D   On: 03/23/2018 11:17    Procedures Procedures (including critical care time)  Medications Ordered in ED Medications  Tdap (BOOSTRIX) injection 0.5 mL (has no administration in time range)  lidocaine (XYLOCAINE) 2 % (with pres) injection 400 mg (400 mg Infiltration Given 03/23/18 1049)  sodium chloride 0.9 % bolus 1,000 mL (1,000 mLs Intravenous New Bag/Given 03/23/18 1013)     Initial Impression / Assessment and Plan / ED Course  I have reviewed the triage vital signs and the nursing notes.  Pertinent labs & imaging results that were available during my care of the patient were reviewed by me and considered in my medical decision making (see chart for details).    PA Hedges sutured up scalp laceration.  Pt given tetanus shot in ED.  Pt's syncope likely from the vertigo.  I don't think it is a cardiac etiology.  Pt is asymptomatic now and is instructed to return if worse.  Final Clinical Impressions(s) / ED Diagnoses   Final diagnoses:  Syncope, unspecified syncope type  Scalp laceration, initial encounter  Coccyx contusion, initial encounter  Vertigo    ED Discharge Orders    None       Jacalyn Lefevre, MD 03/23/18 1145

## 2018-03-23 NOTE — ED Triage Notes (Signed)
Pt states she became dizzy in the bathroom this morning. She woke up on the floor. Laceration noted to her head. Also c/o pelvic pain. Reports 1 episode of vomiting. Daughter states she has been confused.

## 2019-12-02 IMAGING — CR DG CHEST 2V
2 series · 2 of 2 positions shown · non-contrast
Comparison: 08/17/2017

CLINICAL DATA: Preop total hip replacement.

EXAM:
CHEST - 2 VIEW

[w chest pa]
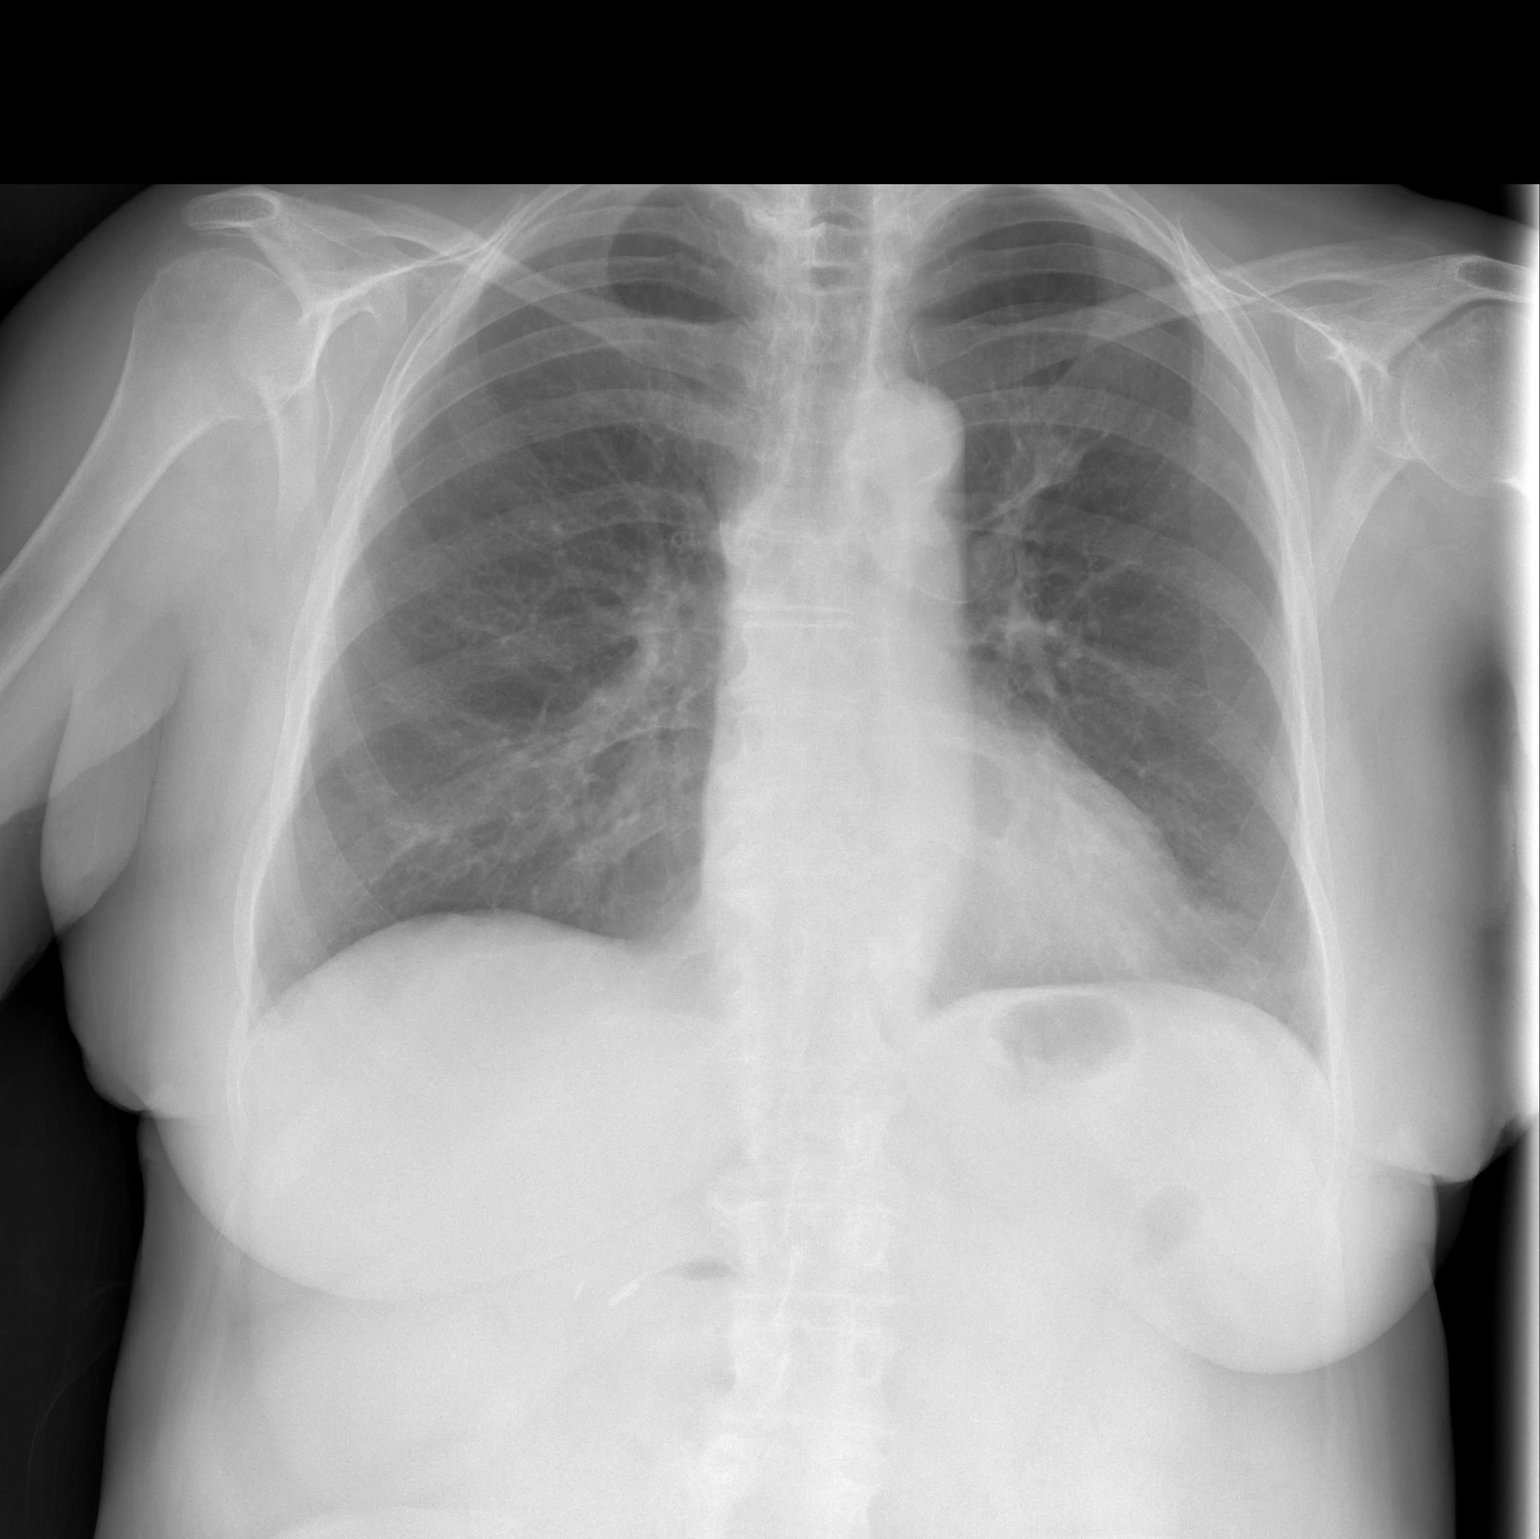

[w chest lat]
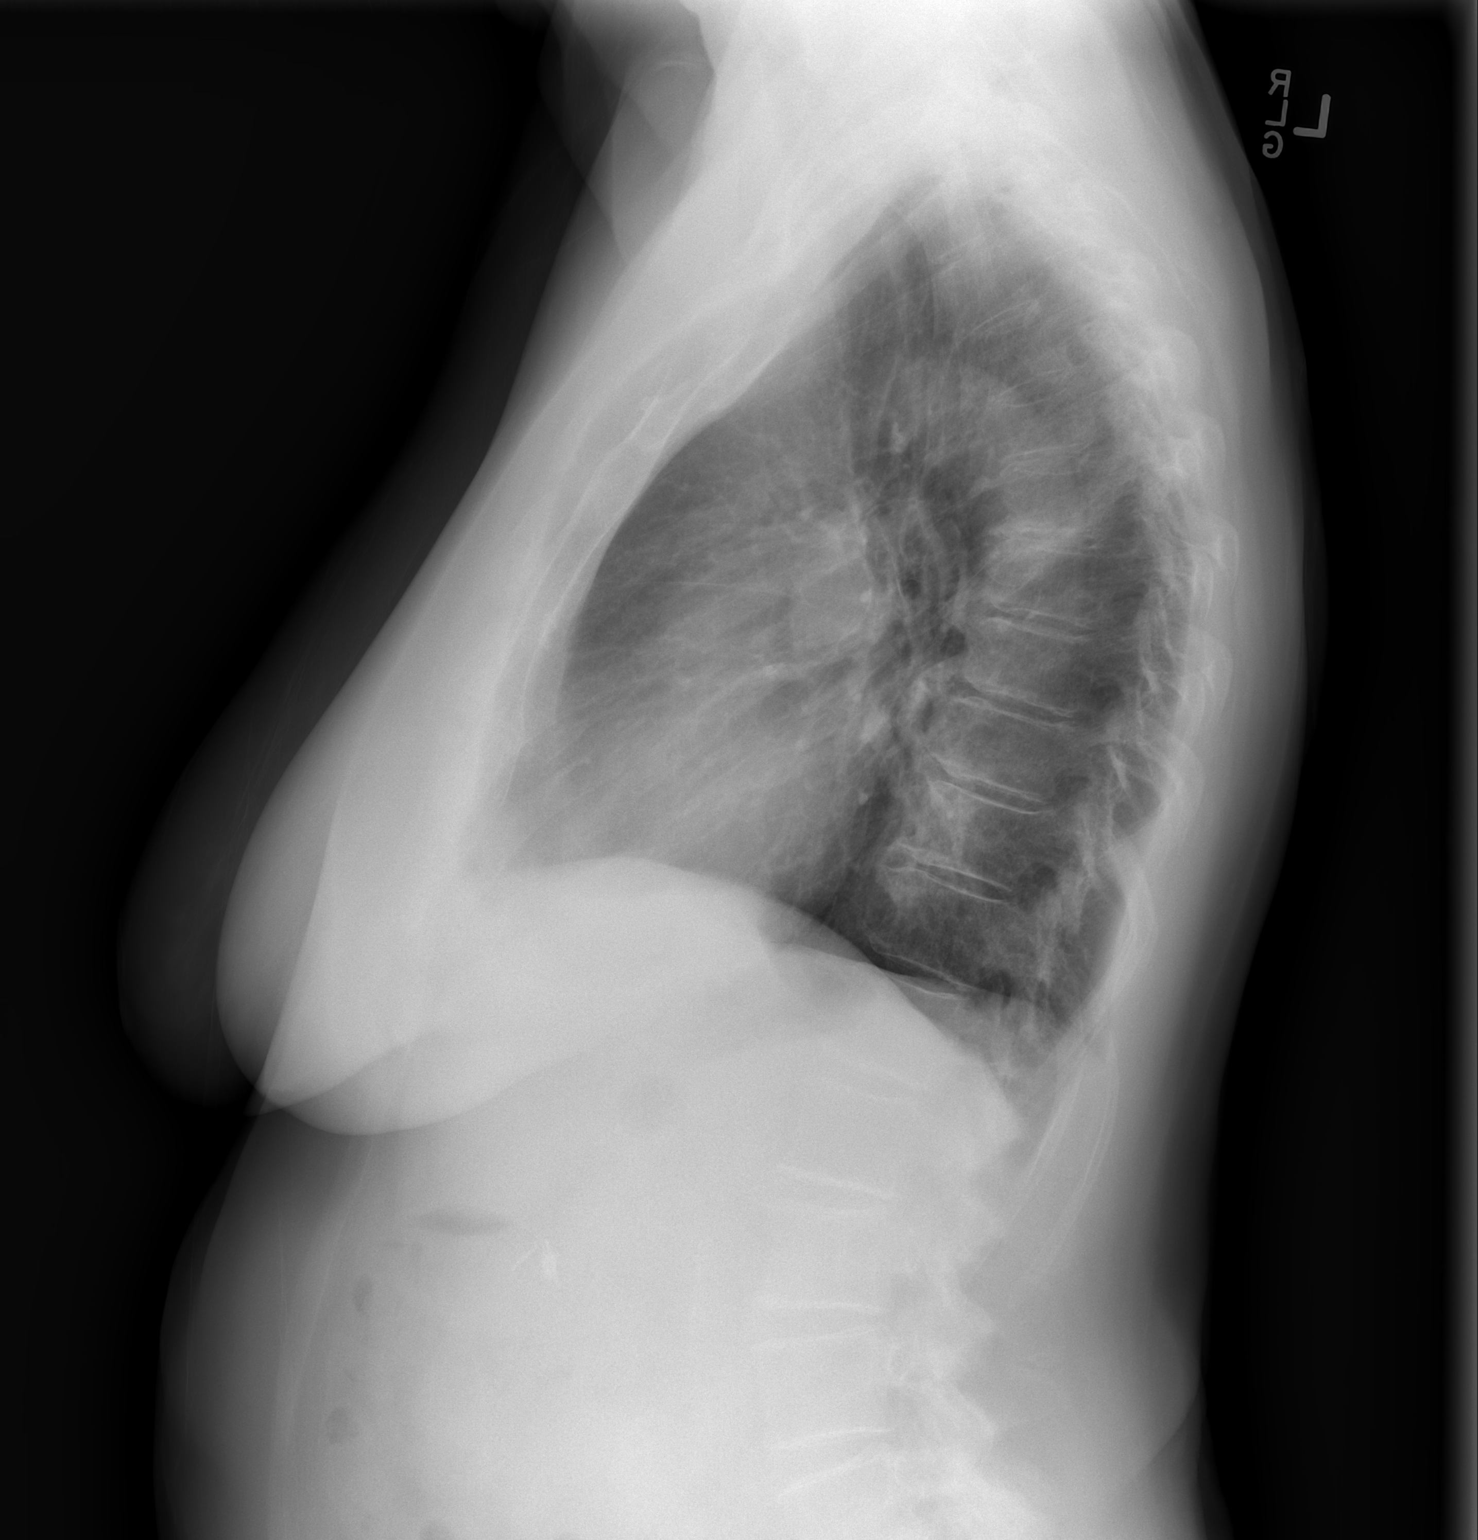

[2 of 2 positions shown; findings below may reference images not displayed]

FINDINGS: The lungs are clear without focal pneumonia, edema, pneumothorax or
pleural effusion. The cardiopericardial silhouette is within normal
limits for size. The visualized bony structures of the thorax are
intact.
IMPRESSION: No active cardiopulmonary disease.

## 2020-02-13 IMAGING — CT CT CERVICAL SPINE W/O CM
4 of 7 series · 12 of 33 positions shown, 13 images · non-contrast
Comparison: None.

CLINICAL DATA: Syncope.  Laceration to posterior scalp.

EXAM:
CT HEAD WITHOUT CONTRAST
CT CERVICAL SPINE WITHOUT CONTRAST
TECHNIQUE: Multidetector CT imaging of the head and cervical spine was
performed following the standard protocol without intravenous
contrast. Multiplanar CT image reconstructions of the cervical spine
were also generated.

[Series 7: c_spine 2.0 i30s 3 · axial · 0.25mm/px · z∈[-268,-160]mm · 4 of 91 slices shown, 5 images]
[im 19/91  soft-tissue]
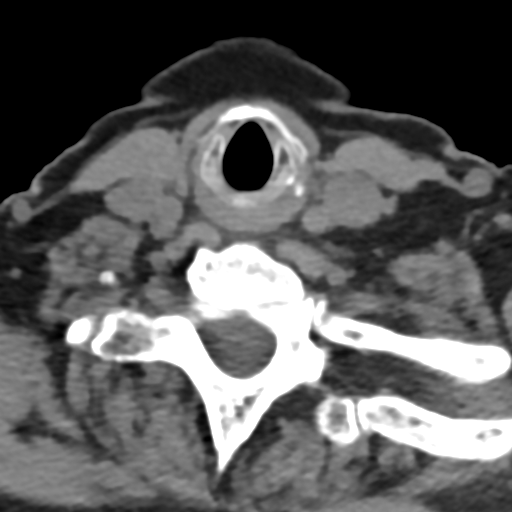
[im 19/91  bone]
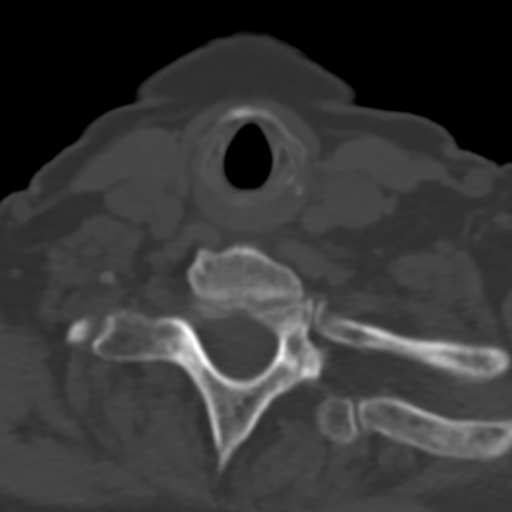
[im 37/91  bone]
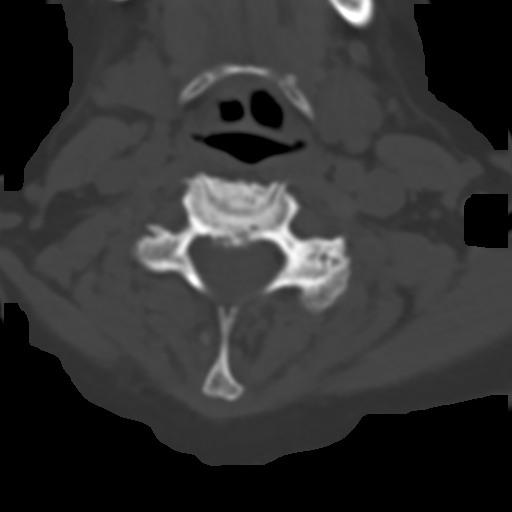
[im 55/91  bone]
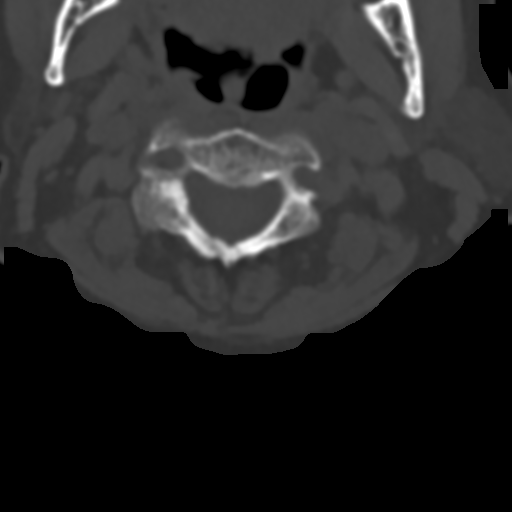
[im 73/91  bone]
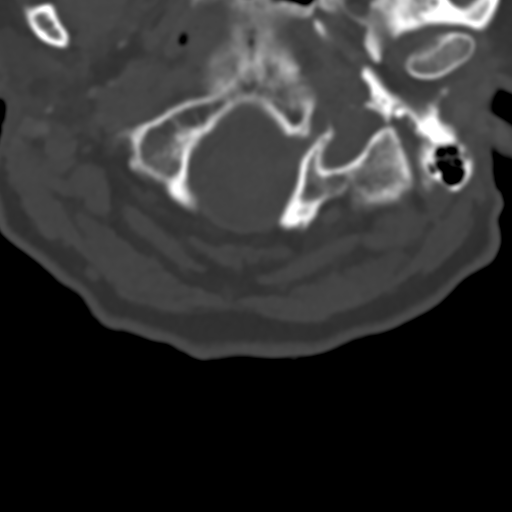

[Series 9: coronals · coronal · 0.28mm/px · 1 of 46 slices shown]
[im 23/46  bone]
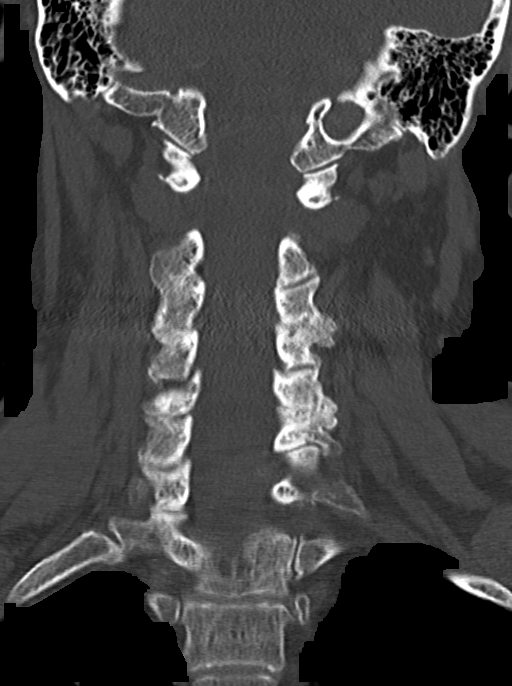

[Series 10: sagittals · sagittal · 0.18mm/px · 5 of 73 slices shown]
[im 11/73  bone]
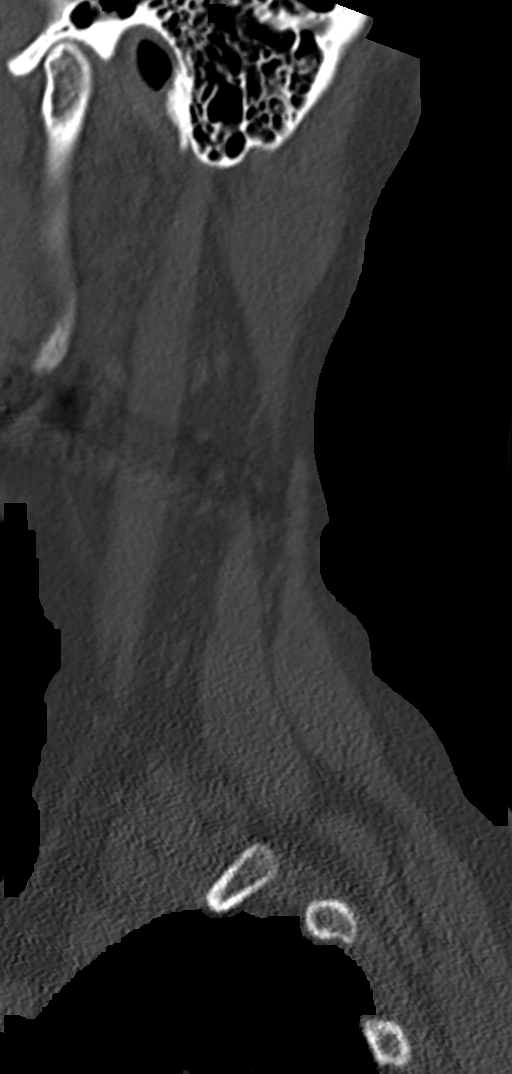
[im 21/73  bone]
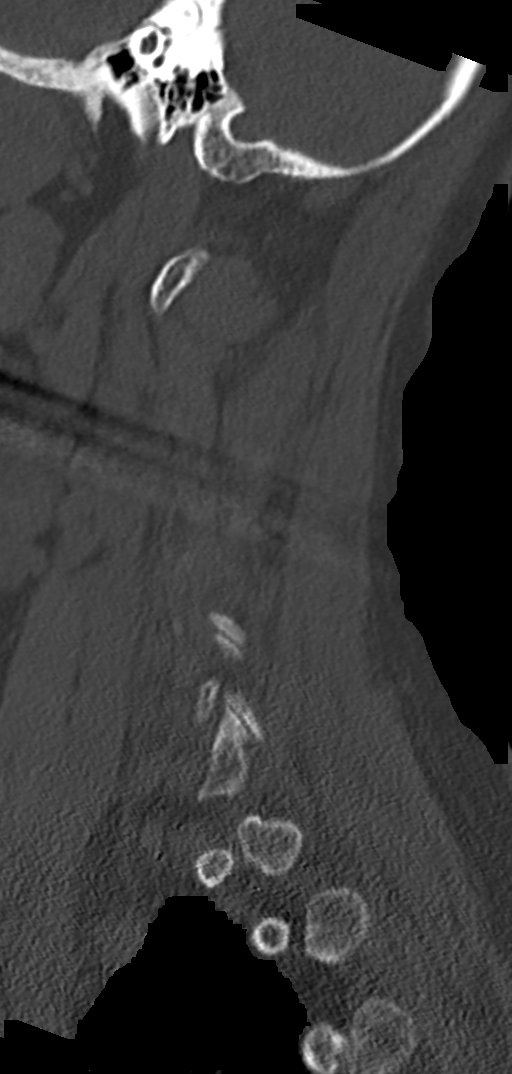
[im 31/73  bone]
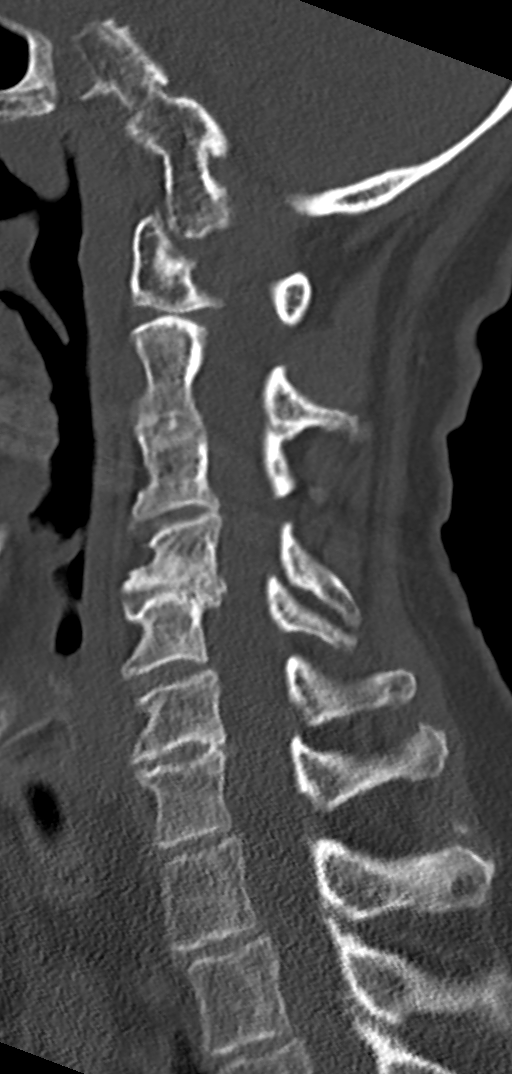
[im 42/73  bone]
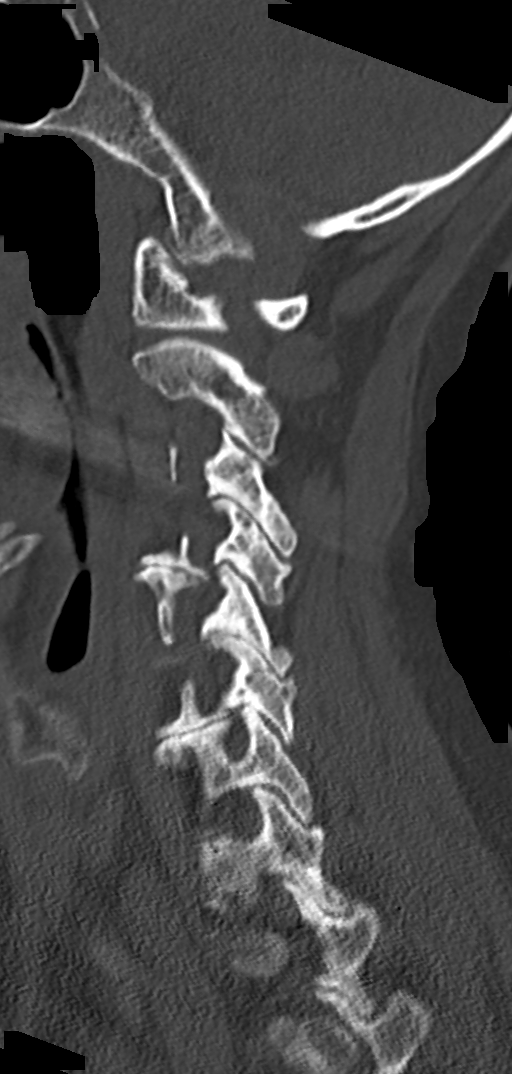
[im 52/73  bone]
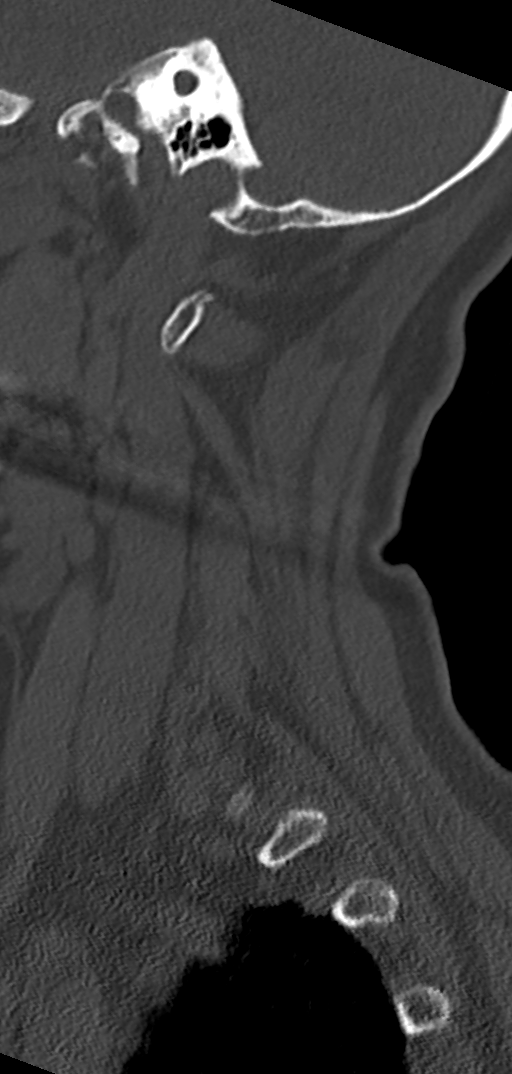

[Series 11: orthogonals · axial · 0.27mm/px · z∈[-298,-262]mm · 2 of 97 slices shown]
[im 20/97  bone]
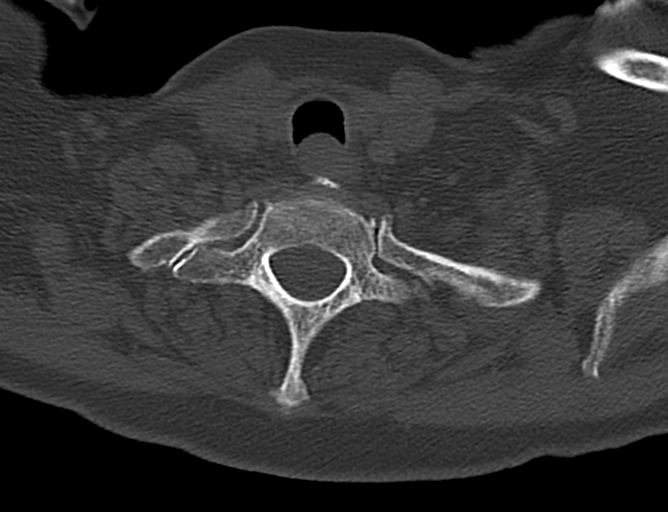
[im 39/97  bone]
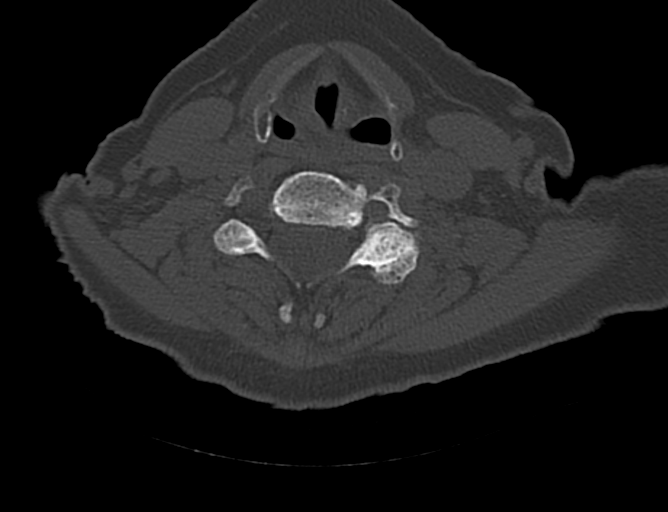

[12 of 33 positions shown; findings below may reference images not displayed]

FINDINGS: CT HEAD FINDINGS

Brain: No subdural, epidural, or subarachnoid hemorrhage.
Cerebellum, brainstem, and basal cisterns are normal. Ventricles and
sulci are unremarkable. Mild white matter changes. No acute cortical
ischemia or infarct identified. No mass effect or midline shift.

Vascular: No hyperdense vessel or unexpected calcification.

Skull: Normal. Negative for fracture or focal lesion.

Sinuses/Orbits: No acute finding.

Other: None.

CT CERVICAL SPINE FINDINGS

Alignment: Minimal retrolisthesis of C4 versus C5 is thought to be
degenerative in nature with no adjacent soft tissue swelling or
fracture noted. Minimal anterolisthesis of C5 versus C6 is also most
likely degenerative. No other malalignment.

Skull base and vertebrae: No fractures noted.

Soft tissues and spinal canal: No prevertebral fluid or swelling. No
visible canal hematoma.

Disc levels: Multilevel degenerative disc disease with small
anterior and posterior osteophytes.

Upper chest: Negative.

Other: No other abnormalities.
IMPRESSION: 1. No acute intracranial abnormalities noted.
2. No fracture or traumatic malalignment in the cervical spine.
Degenerative disc disease and facet degenerative changes noted.
Minimal retrolisthesis is C4 versus C5 and anterolisthesis of C5
versus C6 are thought to be degenerative.

## 2024-05-24 ENCOUNTER — Other Ambulatory Visit: Payer: Self-pay

## 2024-05-24 ENCOUNTER — Emergency Department (HOSPITAL_BASED_OUTPATIENT_CLINIC_OR_DEPARTMENT_OTHER)

## 2024-05-24 ENCOUNTER — Encounter (HOSPITAL_BASED_OUTPATIENT_CLINIC_OR_DEPARTMENT_OTHER): Payer: Self-pay

## 2024-05-24 ENCOUNTER — Emergency Department (HOSPITAL_BASED_OUTPATIENT_CLINIC_OR_DEPARTMENT_OTHER)
Admission: EM | Admit: 2024-05-24 | Discharge: 2024-05-24 | Disposition: A | Discharge status: Home / Self Care | Attending: Emergency Medicine | Admitting: Emergency Medicine

## 2024-05-24 DIAGNOSIS — R509 Fever, unspecified: Secondary | ICD-10-CM | POA: Diagnosis present

## 2024-05-24 DIAGNOSIS — J209 Acute bronchitis, unspecified: Secondary | ICD-10-CM | POA: Diagnosis not present

## 2024-05-24 DIAGNOSIS — R051 Acute cough: Secondary | ICD-10-CM

## 2024-05-24 LAB — RESP PANEL BY RT-PCR (RSV, FLU A&B, COVID)  RVPGX2
Influenza A by PCR: NEGATIVE
Influenza B by PCR: NEGATIVE
Resp Syncytial Virus by PCR: NEGATIVE
SARS Coronavirus 2 by RT PCR: NEGATIVE

## 2024-05-24 MED ORDER — AZITHROMYCIN 250 MG PO TABS: 250.0000 mg | ORAL_TABLET | Freq: Every day | ORAL | 0 refills | Status: AC

## 2024-05-24 MED ORDER — BENZONATATE 100 MG PO CAPS: 100.0000 mg | ORAL_CAPSULE | Freq: Three times a day (TID) | ORAL | 0 refills | Status: AC

## 2024-05-24 NOTE — ED Triage Notes (Signed)
 Pt arrives with c/o cough, sinus pressure, and congestion that started 2 days ago. Pt reports productive cough at times. Pt denies fevers.

## 2024-05-24 NOTE — ED Provider Notes (Addendum)
 " [place] EMERGENCY DEPARTMENT AT [facility] Provider Note   CSN: [id number] Arrival date & time: 05/24/24  0229     History Chief Complaint  Patient presents with   Cough    HPI Helen Horton is a 83 y.o. female presenting for severe cough and fevers and chills. States that symptoms started about 5 days ago and began worsening 3 days ago.  Patient's recorded medical, surgical, social, medication list and allergies were reviewed in the Snapshot window as part of the initial history.   Review of Systems   Review of Systems  Constitutional:  Positive for fever. Negative for chills.  HENT:  Positive for congestion. Negative for ear pain and sore throat.   Eyes:  Negative for pain and visual disturbance.  Respiratory:  Positive for cough. Negative for choking and shortness of breath.   Cardiovascular:  Negative for chest pain and palpitations.  Gastrointestinal:  Negative for abdominal pain and vomiting.  Genitourinary:  Negative for dysuria and hematuria.  Musculoskeletal:  Negative for arthralgias and back pain.  Skin:  Negative for color change and rash.  Neurological:  Negative for seizures and syncope.  All other systems reviewed and are negative.   Physical Exam Updated Vital Signs BP (!) 127/53 (BP Location: Right Arm)   Pulse 80   Temp 98.1 F (36.7 C)   Resp 19   Wt 59 kg   SpO2 95%   BMI 23.59 kg/m  Physical Exam Vitals and nursing note reviewed.  Constitutional:      General: She is not in acute distress.    Appearance: She is well-developed.  HENT:     Head: Normocephalic and atraumatic.  Eyes:     Conjunctiva/sclera: Conjunctivae normal.  Cardiovascular:     Rate and Rhythm: Normal rate and regular rhythm.     Heart sounds: No murmur heard. Pulmonary:     Effort: Respiratory distress present.     Breath sounds: Rhonchi present.  Abdominal:     Palpations: Abdomen is soft.     Tenderness: There is no abdominal tenderness.   Musculoskeletal:        General: No swelling.     Cervical back: Neck supple.  Skin:    General: Skin is warm and dry.     Capillary Refill: Capillary refill takes less than 2 seconds.  Neurological:     Mental Status: She is alert.  Psychiatric:        Mood and Affect: Mood normal.      ED Course/ Medical Decision Making/ A&P    Procedures Procedures   Medications Ordered in ED Medications - No data to display Medical Decision Making:   Marcina Kinnison is a 83 y.o. female who presented to the ED today with subjective fever, cough, congestion detailed above.    Patient placed on continuous vitals and telemetry monitoring while in ED which was reviewed periodically.   Complete initial physical exam performed, notably the patient  was hemodynamically stable in no acute distress.  Posterior oropharynx illuminated and without obvious swelling or deformity.  Patient is without neck stiffness.    Reviewed and confirmed nursing documentation for past medical history, family history, social history.    Initial Assessment:   With the patient's presentation of fever cough congestion, most likely diagnosis is developing viral upper respiratory infection. Other diagnoses were considered including (but not limited to) peritonsillar abscess, retropharyngeal abscess, pneumonia. These are considered less likely due to history of present illness and physical  exam findings.   This is most consistent with an acute life/limb threatening illness complicated by underlying chronic conditions. Considered meningitis, however patient's symptoms, vital signs, physical exam findings including lack of meningismus seem grossly less consistent at this time. Initial Plan:  Viral screening including COVID/flu testing to evaluate for common viral etiologies that need to be tracked CXR to evaluate for structural/infectious intrathoracic pathology.  Empiric treatment with antipyretics including acetaminophen  in  ambulatory setting Objective evaluation as below reviewed   Initial Study Results:   Laboratory  All laboratory results reviewed without evidence of clinically relevant pathology.    Radiology:  All images reviewed independently. Agree with radiology report at this time.   DG Chest 2 View  Final Result         Final Assessment and Plan:   On reassessment, patient is ambulatory tolerating p.o. intake in no acute distress.   Patient's COVID test is negative. 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  She states she has a history of atypical pneumonia/bronchitis and is requesting treatment. Discussed IDSA guidelines about treatment for the symptoms usually pending at least 5 days of severe cough and shortness of breath but given her unique circumstance I think would be reasonable to treat with azithromycin  to prevent development spread of bacterial etiology.  Will start on azithromycin  recommend follow-up with PCP within 48 hours. Patient is currently stable for outpatient care and management with no indication for hospitalization or transfer at this time.  Discussed all findings with patient expressed understanding.  Disposition:  Based on the above findings, I believe patient is stable for discharge.    Patient/family educated about specific return precautions for given chief complaint and symptoms.  Patient/family educated about follow-up with PCP.     Patient/family expressed understanding of return precautions and need for follow-up. Patient spoken to regarding all imaging and laboratory results and appropriate follow up for these results. All education provided in verbal form with additional information in written form. Time was allowed for answering of patient questions. Patient discharged.    Emergency Department Medication Summary:    Medications - No data to display   Clinical Impression:  1. Acute cough   2. Acute bronchitis, unspecified organism      Discharge   Final Clinical Impression(s) / ED Diagnoses Final diagnoses:  Acute cough  Acute bronchitis, unspecified organism    Rx / DC Orders ED Discharge Orders          Ordered    azithromycin  (ZITHROMAX ) 250 MG tablet  Daily        05/24/24 0517    benzonatate  (TESSALON ) 100 MG capsule  Every 8 hours        05/24/24 0517              Jerral Meth, MD 05/24/24 9479    Jerral Meth, MD 05/24/24 9473  "
